# Patient Record
Sex: Male | Born: 1960 | Race: White | Hispanic: No | Marital: Married | Smoking: Former smoker
Health system: Southern US, Community
[De-identification: ages and names within clinical notes are randomized; demographics above are authoritative.]

## PROBLEM LIST (undated history)

## (undated) DIAGNOSIS — J1282 Pneumonia due to coronavirus disease 2019: Secondary | ICD-10-CM

## (undated) DIAGNOSIS — U071 COVID-19: Secondary | ICD-10-CM

## (undated) HISTORY — DX: Pneumonia due to coronavirus disease 2019: J12.82

## (undated) HISTORY — DX: COVID-19: U07.1

## (undated) HISTORY — PX: NO PAST SURGERIES: SHX2092

---

## 2013-02-02 ENCOUNTER — Encounter: Payer: Self-pay | Admitting: Family Medicine

## 2013-02-02 ENCOUNTER — Ambulatory Visit (INDEPENDENT_AMBULATORY_CARE_PROVIDER_SITE_OTHER): Payer: Managed Care, Other (non HMO) | Admitting: Family Medicine

## 2013-02-02 VITALS — BP 110/80 | HR 74 | Temp 98.1°F | Resp 16 | Ht 71.0 in | Wt 175.0 lb

## 2013-02-02 DIAGNOSIS — H8110 Benign paroxysmal vertigo, unspecified ear: Secondary | ICD-10-CM

## 2013-02-02 NOTE — Progress Notes (Signed)
  Subjective:    Patient ID: Frederick Williams, male    DOB: [birth year], 52 y.o.   MRN: [id number]  HPI Patient reports 3 weeks of episodic vertigo triggered by turning his head sitting up or lying down. He denies any ringing in the ears or hearing loss that is new in onset. He denies any headaches. He reports nausea. He also reports nystagmus occurs sometimes with vertigo. No past medical history on file. No current outpatient prescriptions on file prior to visit.   No current facility-administered medications on file prior to visit.   Allergies  Allergen Reactions  . Penicillins    History   Social History  . Marital Status: Married    Spouse Name: N/A    Number of Children: N/A  . Years of Education: N/A   Occupational History  . Not on file.   Social History Main Topics  . Smoking status: Former Games developer  . Smokeless tobacco: Former Neurosurgeon    Quit date: 02/02/2011  . Alcohol Use: Yes  . Drug Use: No  . Sexual Activity: Not on file   Other Topics Concern  . Not on file   Social History Narrative  . No narrative on file      Review of Systems  All other systems reviewed and are negative.       Objective:   Physical Exam  Vitals reviewed. Constitutional: He is oriented to person, place, and time. He appears well-developed and well-nourished.  Eyes: Conjunctivae and EOM are normal. Pupils are equal, round, and reactive to light.  Neck: Neck supple. No JVD present. No thyromegaly present.  Cardiovascular: Normal rate, regular rhythm and normal heart sounds.  Exam reveals no gallop and no friction rub.   No murmur heard. Pulmonary/Chest: Effort normal and breath sounds normal. No respiratory distress. He has no wheezes. He has no rales.  Abdominal: Soft. Bowel sounds are normal. He exhibits no distension. There is no tenderness. There is no rebound.  Lymphadenopathy:    He has no cervical adenopathy.  Neurological: He is alert and oriented to person, place, and time.  He has normal reflexes. He displays normal reflexes. No cranial nerve deficit. He exhibits normal muscle tone. Coordination normal.   patient has a positive Dix-Hallpike maneuver to the left        Assessment & Plan:  1. 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 The symptoms have been going on for more than 3 weeks. He would like to see an ENT doctor for Epley maneuvers.  I will arrange this consultation. - Ambulatory referral to ENT

## 2013-02-04 ENCOUNTER — Telehealth: Payer: Self-pay | Admitting: Family Medicine

## 2013-02-04 NOTE — Telephone Encounter (Signed)
Pt called and states that he is still having a lot of dizziness with the movement of his neck to the left and he has a ENT appt for 02/10/13  - he would like to know if he can have a work not to be out until then. 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 If so he needs it for the following date ; 10/2,10/3, 10/6 and 10/7. If ok to do pt needs it faxed to his employment  At [phone].

## 2013-02-05 NOTE — Telephone Encounter (Signed)
Please fax an out of work note for the days requested.

## 2013-02-06 ENCOUNTER — Other Ambulatory Visit: Payer: Self-pay | Admitting: Otolaryngology

## 2013-02-06 DIAGNOSIS — H905 Unspecified sensorineural hearing loss: Secondary | ICD-10-CM

## 2013-02-06 DIAGNOSIS — R42 Dizziness and giddiness: Secondary | ICD-10-CM

## 2013-02-09 ENCOUNTER — Telehealth: Payer: Self-pay | Admitting: Family Medicine

## 2013-02-09 NOTE — Telephone Encounter (Signed)
Ok with note. 

## 2013-02-09 NOTE — Telephone Encounter (Signed)
?   ok for note.

## 2013-02-11 ENCOUNTER — Ambulatory Visit
Admission: RE | Admit: 2013-02-11 | Discharge: 2013-02-11 | Disposition: A | Discharge status: Home / Self Care | Payer: Managed Care, Other (non HMO) | Source: Ambulatory Visit | Attending: Otolaryngology | Admitting: Otolaryngology

## 2013-02-11 DIAGNOSIS — R42 Dizziness and giddiness: Secondary | ICD-10-CM

## 2013-02-11 DIAGNOSIS — H905 Unspecified sensorineural hearing loss: Secondary | ICD-10-CM

## 2013-02-11 MED ORDER — GADOBENATE DIMEGLUMINE 529 MG/ML IV SOLN
16.0000 mL | Freq: Once | INTRAVENOUS | Status: AC | PRN
  Administered 2013-02-11: 16 mL via INTRAVENOUS

## 2013-02-11 NOTE — Telephone Encounter (Signed)
LMTRC

## 2013-02-13 ENCOUNTER — Encounter: Payer: Self-pay | Admitting: Family Medicine

## 2013-02-13 ENCOUNTER — Ambulatory Visit (INDEPENDENT_AMBULATORY_CARE_PROVIDER_SITE_OTHER): Payer: Managed Care, Other (non HMO) | Admitting: Family Medicine

## 2013-02-13 VITALS — BP 130/88 | HR 92 | Temp 96.7°F | Resp 18 | Ht 71.0 in | Wt 173.0 lb

## 2013-02-13 DIAGNOSIS — H8110 Benign paroxysmal vertigo, unspecified ear: Secondary | ICD-10-CM

## 2013-02-13 MED ORDER — MECLIZINE HCL 25 MG PO TABS: 25.0000 mg | ORAL_TABLET | Freq: Three times a day (TID) | ORAL | Status: DC | PRN

## 2013-02-13 NOTE — Progress Notes (Signed)
Subjective:    Patient ID: Frederick Williams, male    DOB: 1961/03/15, 52 y.o.   MRN: [id number]  HPI 02/02/13 Patient reports 3 weeks of episodic vertigo triggered by turning his head sitting up or lying down. He denies any ringing in the ears or hearing loss that is new in onset. He denies any headaches. He reports nausea. He also reports nystagmus occurs sometimes with vertigo.  At that time, my plan was: 1. 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 The symptoms have been going on for more than 3 weeks. He would like to see an ENT doctor for Epley maneuvers.  I will arrange this consultation. - Ambulatory referral to ENT  Patient saw ENT. He is has had an MRI of the brain which revealed no central cause for his vertigo. There is no evidence of cerebellar stroke. There was no acoustic neuroma. The patient states he continues to have mild vertigo with position changes. It is almost always associated with head turning although the symptoms seem to be in improving. No past medical history on file. Current Outpatient Prescriptions on File Prior to Visit  Medication Sig Dispense Refill  . loratadine (CLARITIN) 10 MG tablet Take 10 mg by mouth daily.       No current facility-administered medications on file prior to visit.   Allergies  Allergen Reactions  . Penicillins    History   Social History  . Marital Status: Married    Spouse Name: N/A    Number of Children: N/A  . Years of Education: N/A   Occupational History  . Not on file.   Social History Main Topics  . Smoking status: Former Games developer  . Smokeless tobacco: Former Neurosurgeon    Quit date: 02/02/2011  . Alcohol Use: Yes  . Drug Use: No  . Sexual Activity: Not on file   Other Topics Concern  . Not on file   Social History Narrative  . No narrative on file      Review of Systems  All other systems reviewed and are negative.       Objective:   Physical Exam   Vitals reviewed. Constitutional: He is oriented to person, place, and time. He appears well-developed and well-nourished.  Eyes: Conjunctivae and EOM are normal. Pupils are equal, round, and reactive to light.  Neck: Neck supple. No JVD present. No thyromegaly present.  Cardiovascular: Normal rate, regular rhythm and normal heart sounds.  Exam reveals no gallop and no friction rub.   No murmur heard. Pulmonary/Chest: Effort normal and breath sounds normal. No respiratory distress. He has no wheezes. He has no rales.  Abdominal: Soft. Bowel sounds are normal. He exhibits no distension. There is no tenderness. There is no rebound.  Lymphadenopathy:    He has no cervical adenopathy.  Neurological: He is alert and oriented to person, place, and time. He has normal reflexes. No cranial nerve deficit. He exhibits normal muscle tone. Coordination normal.   patient has a positive Dix-Hallpike maneuver to the left        Assessment & Plan:   1. BPPV (benign paroxysmal positional vertigo) At this point, I feel confident the patient has BPPV. I performed particle repositioning maneuvers today in the office for approx. 15 minutes. Afterward the patient felt much better. I gave him a handout on how to perform the maneuvers at home. I also gave him prescription for meclizine 25 mg every 8 hours as needed. If  he is able to perform exercises at home and the symptoms continue to subside until he can return to work next week

## 2013-10-12 ENCOUNTER — Encounter: Payer: Self-pay | Admitting: Family Medicine

## 2013-10-12 ENCOUNTER — Ambulatory Visit: Payer: Managed Care, Other (non HMO) | Admitting: Family Medicine

## 2013-10-12 ENCOUNTER — Ambulatory Visit (INDEPENDENT_AMBULATORY_CARE_PROVIDER_SITE_OTHER): Payer: PRIVATE HEALTH INSURANCE | Admitting: Family Medicine

## 2013-10-12 VITALS — BP 110/80 | HR 74 | Temp 97.2°F | Resp 16 | Ht 71.0 in | Wt 171.0 lb

## 2013-10-12 DIAGNOSIS — B9789 Other viral agents as the cause of diseases classified elsewhere: Secondary | ICD-10-CM

## 2013-10-12 DIAGNOSIS — B349 Viral infection, unspecified: Secondary | ICD-10-CM

## 2013-10-12 NOTE — Progress Notes (Signed)
   Subjective:    Patient ID: Frederick Williams, male    DOB: 1960/07/16, 53 y.o.   MRN: [id number]  HPI Patient presents with less than 24 hours of dull diffuse headache, slight cough, some nausea, and some diarrhea. His wife has been battling a viral illness for one week characterized by chest congestion cough and fevers.  Patient denies any tick bites he denies any meningismus. There is no rash. He denies any sore throat or otalgia. He does report slightly worse tinnitus than normal.  He denies any dysuria or hematuria. No past medical history on file. No current outpatient prescriptions on file prior to visit.   No current facility-administered medications on file prior to visit.   Allergies  Allergen Reactions  . Penicillins    History   Social History  . Marital Status: Married    Spouse Name: N/A    Number of Children: N/A  . Years of Education: N/A   Occupational History  . Not on file.   Social History Main Topics  . Smoking status: Former Games developer  . Smokeless tobacco: Former Neurosurgeon    Quit date: 02/02/2011  . Alcohol Use: Yes  . Drug Use: No  . Sexual Activity: Not on file   Other Topics Concern  . Not on file   Social History Narrative  . No narrative on file      Review of Systems  All other systems reviewed and are negative.      Objective:   Physical Exam  Vitals reviewed. Constitutional: He is oriented to person, place, and time. He appears well-developed and well-nourished. No distress.  HENT:  Head: Normocephalic and atraumatic.  Right Ear: External ear normal.  Left Ear: External ear normal.  Nose: Nose normal.  Mouth/Throat: Oropharynx is clear and moist. No oropharyngeal exudate.  Eyes: Conjunctivae are normal.  Neck: Neck supple.  Cardiovascular: Normal rate, regular rhythm and normal heart sounds.  Exam reveals no gallop and no friction rub.   No murmur heard. Pulmonary/Chest: Effort normal and breath sounds normal. No respiratory distress.  He has no wheezes. He has no rales. He exhibits no tenderness.  Abdominal: Soft. Bowel sounds are normal. He exhibits no distension and no mass. There is no tenderness. There is no rebound and no guarding.  Lymphadenopathy:    He has no cervical adenopathy.  Neurological: He is alert and oriented to person, place, and time. He has normal reflexes. He displays normal reflexes. No cranial nerve deficit. Coordination normal.  Skin: No rash noted. He is not diaphoretic.          Assessment & Plan:  1. Viral syndrome At this point, it is too early to determine what the cause of the patient's symptoms may be. However based on history and his recent sick contacts, I suspect a viral syndrome. I recommended tincture of time for the next 5-7 days.  I would recommend symptomatic care only. He can use Tylenol or ibuprofen as needed for headache or fever. He can use Imodium as needed for diarrhea. He can use Mucinex as needed for cough. I recommended rest and that he push fluids.  Recheck in 48 hours if no better or sooner if getting worse or changing.

## 2013-10-14 ENCOUNTER — Encounter: Payer: Self-pay | Admitting: Family Medicine

## 2013-12-31 ENCOUNTER — Encounter: Payer: Self-pay | Admitting: Family Medicine

## 2015-07-25 ENCOUNTER — Ambulatory Visit (INDEPENDENT_AMBULATORY_CARE_PROVIDER_SITE_OTHER): Payer: Managed Care, Other (non HMO) | Admitting: Pediatrics

## 2015-07-25 ENCOUNTER — Encounter: Payer: Self-pay | Admitting: Pediatrics

## 2015-07-25 VITALS — BP 126/82 | HR 78 | Temp 97.2°F | Ht 71.0 in | Wt 179.4 lb

## 2015-07-25 DIAGNOSIS — J069 Acute upper respiratory infection, unspecified: Secondary | ICD-10-CM

## 2015-07-25 NOTE — Progress Notes (Signed)
    Subjective:    Patient ID: Frederick Williams, male    DOB: 1960/09/25, 55 y.o.   MRN: [id number]  CC: Nasal Congestion and Sinus Pressure   HPI: Frederick Williams is a 55 y.o. male presenting for Nasal Congestion and Sinus Pressure  5 days ago woke up with sore throat Went home early from work Seen in clinic at work next door Given prednisone for bronchitis, took one dose, didn't help Taking ibuprofen 3 tabs three times a day Lots of water Woke up yesterday    [facility]  Decreased Interest 0  Down, Depressed, Hopeless 0  PHQ - 2 Score 0    ROS: All systems negative other than what is in HPI  Fam hx: no current sick contacts at home  Social History   Social History  . Marital Status: Married    Spouse Name: N/A  . Number of Children: N/A  . Years of Education: N/A   Occupational History  . Not on file.   Social History Main Topics  . Smoking status: Former Games developermoker  . Smokeless tobacco: Former NeurosurgeonUser    Quit date: 02/02/2011  . Alcohol Use: Yes  . Drug Use: No  . Sexual Activity: Not on file   Other Topics Concern  . Not on file   Social History Narrative        Objective:    BP 126/82 mmHg  Pulse 78  Temp(Src) 97.2 F (36.2 C) (Oral)  Ht 5\' 11"  (1.803 m)  Wt 179 lb 6.4 oz (81.375 kg)  BMI 25.03 kg/m2  Wt Readings from Last 3 Encounters:  07/25/15 179 lb 6.4 oz (81.375 kg)  10/12/13 171 lb (77.565 kg)  02/13/13 173 lb (78.472 kg)     Gen: NAD, alert, cooperative with exam, NCAT EYES: EOMI, no scleral injection or icterus ENT:  TMs pearly gray b/l, OP without erythema, slight pressure over max and frontal sinuses with palpation LYMPH: no cervical LAD CV: NRRR, normal S1/S2, no murmur, distal pulses 2+ b/l Resp: CTABL, no wheezes, normal WOB Abd: +BS, soft, NTND. no guarding or organomegaly Ext: No edema, warm Neuro: Alert and oriented, strength equal b/l UE and LE, coordination grossly normal MSK: normal muscle bulk       Assessment & Plan:    Frederick Williams was seen today for nasal congestion and sinus pressure, likely due to viral illness. Discussed symptomatic care as below.   Diagnoses and all orders for this visit:  Acute URI  Cetirizine 10mg  daily Netipot with distilled water 2-3 times a day to clear out sinuses Or Normal saline nasal spray Flonase steroid nasal spray Ibuprofen 600mg  three times a day Lots of fluids Can take benadryl at night  Call if not improving by the end of the week.  Follow up plan: As needed  Rex Krasarol Vincent, MD [facility] 07/25/2015, 8:40 AM

## 2015-07-25 NOTE — Patient Instructions (Addendum)
Cetirizine 10mg  Netipot with distilled water 2-3 times a day to clear out sinuses Or Normal saline nasal spray Flonase steroid nasal spray Ibuprofen 600mg  three times a day Lots of fluids Can take benadryl at night  Call if not improving by the end of the week.

## 2015-07-26 ENCOUNTER — Telehealth: Payer: Self-pay | Admitting: Pediatrics

## 2015-07-26 NOTE — Telephone Encounter (Signed)
Work note was faxed, patient informed

## 2016-02-16 ENCOUNTER — Ambulatory Visit (INDEPENDENT_AMBULATORY_CARE_PROVIDER_SITE_OTHER): Payer: Managed Care, Other (non HMO) | Admitting: Family Medicine

## 2016-02-16 ENCOUNTER — Encounter: Payer: Self-pay | Admitting: Family Medicine

## 2016-02-16 VITALS — BP 130/89 | HR 85 | Temp 97.1°F | Ht 71.0 in | Wt 174.2 lb

## 2016-02-16 DIAGNOSIS — H66002 Acute suppurative otitis media without spontaneous rupture of ear drum, left ear: Secondary | ICD-10-CM | POA: Diagnosis not present

## 2016-02-16 MED ORDER — AZITHROMYCIN 250 MG PO TABS: ORAL_TABLET | ORAL | 0 refills | Status: DC

## 2016-02-16 NOTE — Progress Notes (Signed)
BP 130/89   Pulse 85   Temp 97.1 F (36.2 C) (Oral)   Ht 5\' 11"  (1.803 m)   Wt 174 lb 4 oz (79 kg)   BMI 24.30 kg/m    Subjective:    Patient ID: Frederick Williams, male    DOB: 1961-02-08, 55 y.o.   MRN: [id number]  HPI: Frederick Williams is a 55 y.o. male presenting on 02/16/2016 for Left ear pain, dizziness (began 4 days ago; pt works [employer] and saw person at clinic there today who told him he has an inner ear infection but they could not prescribe medication) and Headache   HPI Left ear pain Patient comes in today with complaints of left ear pain that has been going on for the past 4 days. [facility] and told him he that they had seen an ear ear infection but that they could not treat it because they did not have prescribing privileges. He denies any fevers or chills. He does complain of some dizziness and lightheadedness associated with some headaches mostly surrounding the left side near his left ear. He also has significant pain just behind his left ear as well. He denies any sick contacts that he knows of and he has not had these recurrently in the past.  Relevant past medical, surgical, family and social history reviewed and updated as indicated. Interim medical history since our last visit reviewed. Allergies and medications reviewed and updated.  Review of Systems  Constitutional: Negative for chills and fever.  HENT: Positive for congestion and ear pain. Negative for ear discharge, postnasal drip, rhinorrhea, sinus pressure, sneezing, sore throat and voice change.   Eyes: Negative for pain, discharge, redness and visual disturbance.  Respiratory: Negative for cough, chest tightness, shortness of breath and wheezing.   Cardiovascular: Negative for chest pain and leg swelling.  Musculoskeletal: Negative for gait problem.  Skin: Negative for rash.  All other systems reviewed and are negative.   Per HPI unless specifically indicated above       Medication List       Accurate as of 02/16/16 12:40 PM. Always use your most recent med list.          azithromycin 250 MG tablet Commonly known as:  ZITHROMAX Take 2 the first day and then one each day after.   cetirizine 10 MG tablet Commonly known as:  ZYRTEC Take 10 mg by mouth daily.          Objective:    BP 130/89   Pulse 85   Temp 97.1 F (36.2 C) (Oral)   Ht 5\' 11"  (1.803 m)   Wt 174 lb 4 oz (79 kg)   BMI 24.30 kg/m   Wt Readings from Last 3 Encounters:  02/16/16 174 lb 4 oz (79 kg)  07/25/15 179 lb 6.4 oz (81.4 kg)  10/12/13 171 lb (77.6 kg)    Physical Exam  Constitutional: He is oriented to person, place, and time. He appears well-developed and well-nourished. No distress.  HENT:  Right Ear: Tympanic membrane, external ear and ear canal normal.  Left Ear: Ear canal normal. There is tenderness. No mastoid tenderness. Tympanic membrane is injected, erythematous and bulging. Tympanic membrane is not scarred and not perforated. A middle ear effusion is present.  Nose: No mucosal edema, rhinorrhea or sinus tenderness. No epistaxis. Right sinus exhibits no maxillary sinus tenderness and no frontal sinus tenderness. Left sinus exhibits no maxillary sinus  tenderness and no frontal sinus tenderness.  Mouth/Throat: Uvula is midline and mucous membranes are normal. No oropharyngeal exudate, posterior oropharyngeal edema, posterior oropharyngeal erythema or tonsillar abscesses.  Eyes: Conjunctivae and EOM are normal. Pupils are equal, round, and reactive to light. Right eye exhibits no discharge. Left eye exhibits no discharge. No scleral icterus.  Neck: Neck supple. No thyromegaly present.  Cardiovascular: Normal rate, regular rhythm, normal heart sounds and intact distal pulses.   No murmur heard. Pulmonary/Chest: Effort normal and breath sounds normal. No respiratory distress. He has no wheezes. He has no rales.  Musculoskeletal: Normal range of motion. He exhibits  no edema.  Lymphadenopathy:    He has no cervical adenopathy.  Neurological: He is alert and oriented to person, place, and time. Coordination normal.  Skin: Skin is warm and dry. No rash noted. He is not diaphoretic.  Psychiatric: He has a normal mood and affect. His behavior is normal.  Nursing note and vitals reviewed.   No results found for this or any previous visit.    Assessment & Plan:   Problem List Items Addressed This Visit    None    Visit Diagnoses    Acute suppurative otitis media of left ear without spontaneous rupture of tympanic membrane, recurrence not specified    -  Primary   Relevant Medications   azithromycin (ZITHROMAX) 250 MG tablet       Follow up plan: Return if symptoms worsen or fail to improve.  Counseling provided for all of the vaccine components No orders of the defined types were placed in this encounter.   Arville Care, MD [facility] 02/16/2016, 12:40 PM

## 2016-07-02 ENCOUNTER — Encounter: Payer: Self-pay | Admitting: Family Medicine

## 2016-07-02 ENCOUNTER — Ambulatory Visit (INDEPENDENT_AMBULATORY_CARE_PROVIDER_SITE_OTHER): Payer: Managed Care, Other (non HMO) | Admitting: Family Medicine

## 2016-07-02 VITALS — BP 133/81 | HR 80 | Temp 97.6°F | Ht 71.0 in | Wt 179.0 lb

## 2016-07-02 DIAGNOSIS — L723 Sebaceous cyst: Secondary | ICD-10-CM | POA: Diagnosis not present

## 2016-07-02 DIAGNOSIS — L089 Local infection of the skin and subcutaneous tissue, unspecified: Secondary | ICD-10-CM

## 2016-07-02 MED ORDER — ACETAMINOPHEN-CODEINE #3 300-30 MG PO TABS: 1.0000 | ORAL_TABLET | ORAL | 0 refills | Status: DC | PRN

## 2016-07-02 MED ORDER — CIPROFLOXACIN HCL 500 MG PO TABS: 500.0000 mg | ORAL_TABLET | Freq: Two times a day (BID) | ORAL | 0 refills | Status: DC

## 2016-07-02 NOTE — Progress Notes (Signed)
Subjective:  Patient ID: Frederick Williams, male    DOB: 1961/03/13  Age: 56 y.o. MRN: [id number]  CC: Cyst (pt here today c/o cyst on his back that has become inflamed and red)   HPI Frederick CrapeJames L Williams presents for Sit back against the seat due to pain. Onset 2 days ago. Increasing. He's had to shower with dirty water because of some problems with his well at home. 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 Patient would like to have the cyst removed.  History Frederick Williams has no past medical history on file.   He has no past surgical history on file.   His family history includes Dementia in his mother; Hypertension in his sister.He reports that he has quit smoking. He quit smokeless tobacco use about 5 years ago. He reports that he drinks alcohol. He reports that he does not use drugs.  No current outpatient prescriptions on file prior to visit.   No current facility-administered medications on file prior to visit.     ROS Review of Systems Noncontributory Objective:  BP 133/81   Pulse 80   Temp 97.6 F (36.4 C) (Oral)   Ht 5\' 11"  (1.803 m)   Wt 179 lb (81.2 kg)   BMI 24.97 kg/m   Physical Exam  Constitutional: He is oriented to person, place, and time. He appears well-developed and well-nourished. No distress.  HENT:  Head: Normocephalic.  Cardiovascular: Normal heart sounds.   Pulmonary/Chest: Breath sounds normal.  Musculoskeletal: Normal range of motion.  Neurological: He is alert and oriented to person, place, and time.  Skin: Skin is warm and dry.  There is a 2 x 3 cm raised fluctuant and erythematous cyst with a double head on it. This is just to the right of the midline and just below the angle of the scapula on the right.  Psychiatric: He has a normal mood and affect.    Assessment & Plan:   Frederick Williams was seen today for cyst.  Diagnoses and all orders for this visit:  Infected sebaceous cyst  Other orders -     ciprofloxacin  (CIPRO) 500 MG tablet; Take 1 tablet (500 mg total) by mouth 2 (two) times daily. -     acetaminophen-codeine (TYLENOL #3) 300-30 MG tablet; Take 1-2 tablets by mouth every 4 (four) hours as needed for moderate pain.   I have discontinued Frederick Williams's cetirizine and azithromycin. I am also having him start on ciprofloxacin and acetaminophen-codeine.  Meds ordered this encounter  Medications  . ciprofloxacin (CIPRO) 500 MG tablet    Sig: Take 1 tablet (500 mg total) by mouth 2 (two) times daily.    Dispense:  14 tablet    Refill:  0  . acetaminophen-codeine (TYLENOL #3) 300-30 MG tablet    Sig: Take 1-2 tablets by mouth every 4 (four) hours as needed for moderate pain.    Dispense:  24 tablet    Refill:  0   I&D: Region was anesthetized with 2% lidocaine using about 5mL of it. Incision was made on anterior medial aspect of the wound. Significant serosanguineous and purulent drainage was exuded. Culture was taken. Forceps was used to probe the area and break apart any loculations. Region was packed with about 6 inches of quarter inch iodoform gauze. Pressure dressing was placed over top. Bleeding was minimal and patient tolerated procedure well.  Follow-up: Return in about 2 days (around 07/04/2016).  Mechele ClaudeWarren Coreyon Nicotra, M.D.

## 2016-07-02 NOTE — Addendum Note (Signed)
Addended by: Margurite AuerbachOMPTON, Jhordan Mckibben G on: 07/02/2016 05:29 PM   Modules accepted: Orders

## 2016-07-05 ENCOUNTER — Telehealth: Payer: Self-pay | Admitting: Family Medicine

## 2016-07-05 NOTE — Telephone Encounter (Signed)
Spoke with pt, area healing nicely, still draining some, no surrounding redness, soreness much improved. OK to stop the anitbiotic. He is off work the next three days. 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 Let us know if not improving.

## 2016-07-05 NOTE — Telephone Encounter (Signed)
Spoke with pt Pt has taken 2 of the Cipro tablets Now he is having L lower calf pain into the ankle that started 8 hrs after taking 1st dose of Cipro Pt is worried about tendon damage Please review and advise

## 2016-07-06 LAB — ANAEROBIC AND AEROBIC CULTURE

## 2016-08-22 ENCOUNTER — Telehealth: Payer: Self-pay | Admitting: Family Medicine

## 2016-08-22 NOTE — Telephone Encounter (Signed)
Patient was seen 2/26 to drain a cyst on his back and was referred to go see Lynwood Dawley at [facility]. Patient states he went to see Tiffany 4/16 and had cyst removed and got stiches. Patient can not have stiches removed until after 4/30 and is wanting to know if Dr. Oswaldo Done will remove them for him so he won't have to drive all the way to [facility]. Patient is also wanting to know how much it would be for him to be seen and get the stiches removed. Patient aware that Dr. Oswaldo Done will not be back in the office until Monday and also aware I am sending the message to billing for price on a visit. Please advise.

## 2016-08-24 NOTE — Telephone Encounter (Signed)
Yes we can remove stitches but will be extra charge- where as if done at tiffanys office it will not be an added charge

## 2016-08-27 NOTE — Telephone Encounter (Signed)
Patient will come to this office to have stitches removed on 09/03/16

## 2016-09-03 ENCOUNTER — Encounter: Payer: Self-pay | Admitting: Pediatrics

## 2016-09-03 ENCOUNTER — Ambulatory Visit (INDEPENDENT_AMBULATORY_CARE_PROVIDER_SITE_OTHER): Payer: Managed Care, Other (non HMO) | Admitting: Pediatrics

## 2016-09-03 VITALS — BP 139/90 | HR 89 | Temp 97.5°F | Ht 71.0 in | Wt 182.2 lb

## 2016-09-03 DIAGNOSIS — L720 Epidermal cyst: Secondary | ICD-10-CM

## 2016-09-03 DIAGNOSIS — Z4802 Encounter for removal of sutures: Secondary | ICD-10-CM | POA: Diagnosis not present

## 2016-09-03 NOTE — Progress Notes (Signed)
  Subjective:   Patient ID: Frederick Williams, male    DOB: 1960-12-10, 56 y.o.   MRN: [id number] CC: Suture / Staple Removal (Back)  HPI: Frederick Williams is a 56 y.o. male presenting for Suture / Staple Removal (Back)  Had infected epidermal cyst, drained last month, 2 weeks seen by dermatology for cyst removal, had two cysts on back removed Has been healing well works driving trucks, does lean against the site often, bothered by the sutures poking him, otherwise has been healing well, no drainage  Relevant past medical, surgical, family and social history reviewed. Allergies and medications reviewed and updated. History  Smoking Status  . Former Smoker  Smokeless Tobacco  . Former Neurosurgeon  . Quit date: 02/02/2011   ROS: Per HPI   Objective:    BP 139/90   Pulse 89   Temp 97.5 F (36.4 C) (Oral)   Ht  (1.803 m)   Wt 182 lb 3.2 oz (82.6 kg)   BMI 25.41 kg/m   Wt Readings from Last 3 Encounters:  09/03/16 182 lb 3.2 oz (82.6 kg)  07/02/16 179 lb (81.2 kg)  02/16/16 174 lb 4 oz (79 kg)    Gen: NAD, alert, cooperative with exam, NCAT EYES: EOMI, no conjunctival injection, or no icterus CV: WWP Resp: normal WOB Abd: +BS, soft, NTND. no guarding or organomegaly Neuro: Alert and oriented Skin: back with two well-healing incisions, surrounding slight erythema, no tenderness   Assessment & Plan:  Frederick Williams was seen today for suture / staple removal.  Diagnoses and all orders for this visit:  Encounter for removal of sutures  Epidermal cyst 3 sutures removed from 2 incisions R mid-lower back. Well healing  Follow up plan: prn Rex Kras, MD [facility]

## 2017-03-20 ENCOUNTER — Ambulatory Visit (INDEPENDENT_AMBULATORY_CARE_PROVIDER_SITE_OTHER): Payer: Managed Care, Other (non HMO)

## 2017-03-20 ENCOUNTER — Ambulatory Visit (INDEPENDENT_AMBULATORY_CARE_PROVIDER_SITE_OTHER): Payer: Managed Care, Other (non HMO) | Admitting: Family Medicine

## 2017-03-20 ENCOUNTER — Encounter: Payer: Self-pay | Admitting: Family Medicine

## 2017-03-20 VITALS — BP 123/77 | HR 85 | Temp 97.2°F | Ht 71.0 in | Wt 178.0 lb

## 2017-03-20 DIAGNOSIS — R0789 Other chest pain: Secondary | ICD-10-CM | POA: Diagnosis not present

## 2017-03-20 DIAGNOSIS — R05 Cough: Secondary | ICD-10-CM

## 2017-03-20 DIAGNOSIS — R071 Chest pain on breathing: Secondary | ICD-10-CM | POA: Diagnosis not present

## 2017-03-20 DIAGNOSIS — R059 Cough, unspecified: Secondary | ICD-10-CM

## 2017-03-20 MED ORDER — AZITHROMYCIN 250 MG PO TABS: ORAL_TABLET | ORAL | 0 refills | Status: DC

## 2017-03-20 NOTE — Progress Notes (Signed)
Subjective: CC: GI upset/ fever PCP: Johna SheriffVincent, Carol L, MD ZOX:WRUEAHPI:Frederick Williams is a 56 y.o. male presenting to clinic today for:  1. GI upset/ Fever Patient reports onset of symptoms Monday.  He notes that he is felt like his stomach was "rolling".  Denies overt nausea.  He denies vomiting, diarrhea, abdominal pain.  He is moving stool normally.  No melena, no hematochezia.  He reports that he feels warm but has had no measured fevers at home.  Not taking any antipyretics.  Additionally, he reports headache that feels "like it is in a vice grip" with associated increased ringing in ears.  He notes that this morning when he took a deep breath then his a left lower ribs hurt.  He does report mild cough for which he is taking 2 allergy pills daily for.  He is eating and drinking normally.  Denies dysuria, urinary frequency, urinary urgency, hematuria.  Denies nasal congestion, chest congestion, rhinorrhea, hemoptysis, lower extremity swelling or pain.  Denies heart palpitations, substernal chest pain, diaphoresis.  He does report dry mouth.  No known sick contacts.  He is a Naval architecttruck driver.  He reports that he stops and gets out of the truck at least every couple hours.  The longest time that he said it in the truck is when he is driving to [place].   Allergies  Allergen Reactions  . Ciprofloxacin     Tendon pain  . Penicillins Rash   No past medical history on file. Family History  Problem Relation Age of Onset  . Dementia Mother   . Hypertension Sister    No current outpatient medications on file.  Social History   Socioeconomic History  . Marital status: Married    Spouse name: None  . Number of children: None  . Years of education: None  . Highest education level: None  Social Needs  . Financial resource strain: None  . Food insecurity - worry: None  . Food insecurity - inability: None  . Transportation needs - medical: None  . Transportation needs - non-medical: None    Occupational History  . None  Tobacco Use  . Smoking status: Former Games developermoker  . Smokeless tobacco: Former NeurosurgeonUser    Quit date: 02/02/2011  Substance and Sexual Activity  . Alcohol use: Yes  . Drug use: No  . Sexual activity: None  Other Topics Concern  . None  Social History Narrative  . None   Health Maintenance: Flu shot   ROS: Per HPI  Objective: Office vital signs reviewed. BP 123/77   Pulse 85   Temp (!) 97.2 F (36.2 C) (Oral)   Ht 5\' 11"  (1.803 m)   Wt 178 lb (80.7 kg)   BMI 24.83 kg/m   Physical Examination:  General: Awake, alert, well nourished, well appearing male, No acute distress HEENT: Normal    Neck: No masses palpated. No lymphadenopathy    Ears: Tympanic membranes intact, normal light reflex, no erythema, no bulging    Eyes: PERRLA, extraocular membranes intact, sclera white, no ocular discharge    Nose: nasal turbinates moist, clear nasal discharge    Throat: moist mucus membranes, no erythema, no tonsillar exudate.  Airway is patent Cardio: regular rate and rhythm, S1S2 heard, no murmurs appreciated Pulm: clear to auscultation bilaterally, no wheezes, rhonchi or rales; normal work of breathing on room air GI: soft, non-tender, non-distended, bowel sounds present x4, no hepatomegaly, no splenomegaly, no masses Ext: Warm, well perfused.  No edema.  +  2 distal pulses.  No tenderness to the calves.  Calves equal in girth.  Negative Homans sign.  Dg Chest 2 View  Result Date: 03/20/2017 CLINICAL DATA:  Fever. EXAM: CHEST  2 VIEW COMPARISON:  None. FINDINGS: The heart size and mediastinal contours are within normal limits. Both lungs are free of consolidation or edema. No effusion or pneumothorax. The visualized skeletal structures are unremarkable. IMPRESSION: No active cardiopulmonary disease. Electronically Signed   By: Elsie StainJohn T Curnes M.D.   On: 03/20/2017 13:48    Assessment/ Plan: 56 y.o. male   1. Cough Patient with fairly unremarkable  cardiopulmonary exam.  He has normal respiratory rate.  He is afebrile with normal vital signs.  He is well-appearing on exam.  He stresses that he has a past medical history of bronchitis which does not resolve without antibiotics.  Chest x-ray was obtained for further evaluation.  There are some bronchitic changes that I can appreciate on personal review.  No pulmonary infiltrates appreciated.  I suspect that he is likely having a viral upper respiratory infection.  Home care instructions were reviewed and a handout was provided.  I did provide patient a pocket prescription of azithromycin to take if he develops any of the signs and symptoms of bacterial infection which were reviewed during today's office visit.  He voiced understanding. Strict return precautions and reasons for emergent evaluation in the emergency department review with patient.  They voiced understanding and will follow-up as needed. - DG Chest 2 View; Future  2. Costochondral chest pain Pain was reproducible on exam and located on left lateral ribs 9 through 12.  I suspect that this is secondary to cough.  Well score for PE is 0.  If his symptoms are not suggestive of cardiac ischemia.  I recommended oral NSAID as needed. - DG Chest 2 View; Future   Orders Placed This Encounter  Procedures  . DG Chest 2 View    Standing Status:   Future    Number of Occurrences:   1    Standing Expiration Date:   05/20/2018    Order Specific Question:   Reason for Exam (SYMPTOM  OR DIAGNOSIS REQUIRED)    Answer:   cough, low left sided chest pain with deep breathing    Order Specific Question:   Preferred imaging location?    Answer:   Internal    Order Specific Question:   Radiology Contrast Protocol - do NOT remove file path    Answer:   [email or web address]   Meds ordered this encounter  Medications  . azithromycin (ZITHROMAX) 250 MG tablet    Sig: Take 2 tablets day 1, then take 1 tablet daily  days 2-5.    Dispense:  6 tablet    Refill:  0     Ashly Hulen SkainsM Gottschalk, DO [facility] [phone]

## 2017-03-20 NOTE — Patient Instructions (Signed)
It appears that you have a viral upper respiratory infection.  This may be an early bronchitis.  Take Motrin for your fevers, headache.    - Get plenty of rest and drink plenty of fluids. - Try to breathe moist air. Use a cold mist humidifier. - Consume warm fluids (soup or tea) to provide relief for a stuffy nose and to loosen phlegm. - For nasal stuffiness, try saline nasal spray or a Neti Pot. Afrin nasal spray can also be used but this product should not be used longer than 3 days or it will cause rebound nasal stuffiness (worsening nasal congestion). - For sore throat pain relief: suck on throat lozenges, hard candy or popsicles; gargle with warm salt water (1/4 tsp. salt per 8 oz. of water); and eat soft, bland foods. - Eat a well-balanced diet. If you cannot, ensure you are getting enough nutrients by taking a daily multivitamin. - Avoid dairy products, as they can thicken phlegm. - Avoid alcohol, as it impairs your body's immune system.  CONTACT YOUR DOCTOR IF YOU EXPERIENCE ANY OF THE FOLLOWING: - High fever - Ear pain - Sinus-type headache - Unusually severe cold symptoms - Cough that gets worse while other cold symptoms improve - Flare up of any chronic lung problem, such as asthma - Your symptoms persist longer than 2 weeks

## 2017-03-22 ENCOUNTER — Telehealth: Payer: Self-pay | Admitting: Pediatrics

## 2017-03-22 NOTE — Telephone Encounter (Signed)
Pt notified note is ready for pick up Note to front

## 2017-03-25 ENCOUNTER — Encounter: Payer: Self-pay | Admitting: Family Medicine

## 2017-03-25 ENCOUNTER — Ambulatory Visit (INDEPENDENT_AMBULATORY_CARE_PROVIDER_SITE_OTHER): Payer: Managed Care, Other (non HMO) | Admitting: Family Medicine

## 2017-03-25 VITALS — BP 131/82 | HR 72 | Temp 97.5°F | Ht 71.0 in | Wt 180.0 lb

## 2017-03-25 DIAGNOSIS — R5381 Other malaise: Secondary | ICD-10-CM

## 2017-03-25 DIAGNOSIS — R5383 Other fatigue: Secondary | ICD-10-CM | POA: Diagnosis not present

## 2017-03-25 NOTE — Patient Instructions (Signed)
I will contact you will the results of your labs.  If anything is abnormal, I will call you.    Weakness Weakness is a lack of strength. You may feel weak all over your body (generalized), or you may feel weak in one specific part of your body (focal). Common causes of weakness include:  Infection and immune system disorders.  Physical exhaustion.  Internal bleeding or other blood loss that results in a lack of red blood cells (anemia).  Dehydration.  An imbalance in mineral (electrolyte) levels, such as potassium.  Heart disease, circulation problems, or stroke.  Other causes include:  Some medicines or cancer treatment.  Stress, anxiety, or depression.  Nervous system disorders.  Thyroid disorders.  Loss of muscle strength because of age or inactivity.  Poor sleep quality or sleep disorders.  The cause of your weakness may not be known. Some causes of weakness can be serious, so it is important to see your health care provider. Follow these instructions at home:  Rest as needed.  Try to get enough sleep. Talk to your health care provider about how much sleep you need each night.  Take over-the-counter and prescription medicines only as told by your health care provider.  Eat a healthy, well-balanced diet. This includes: ? Proteins to build muscles, such as lean meats and fish. ? Fresh fruits and vegetables. ? Carbohydrates to boost energy, such as whole grains.  Drink enough fluid to keep your urine clear or pale yellow.  Do strength exercises, such as arm curls and leg raises, for 30 minutes at least 2 days a week or as told by your health care provider.  Consider working with a physical therapist or trainer who can develop an exercise plan to help you gain muscle strength.  Keep all follow-up visits as told by your health care provider. This is important. Contact a health care provider if:  Your weakness does not improve or gets worse.  Your weakness  affects your ability to think clearly.  Your weakness affects your ability to do your normal daily activities. Get help right away if:  You develop sudden weakness.  You have trouble breathing or shortness of breath.  You have problems with your vision.  You have trouble talking or swallowing.  You have trouble standing or walking.  You have chest pain.  You are light-headed or lose consciousness. This information is not intended to replace advice given to you by your health care provider. Make sure you discuss any questions you have with your health care provider. Document Released: 04/23/2005 Document Revised: 05/19/2015 Document Reviewed: 02/11/2015 Elsevier Interactive Patient Education  [employer]2018 Elsevier Inc.

## 2017-03-25 NOTE — Progress Notes (Signed)
Subjective: CC: not feeling better PCP: Frederick Maize, MD NTI:RWERX Frederick Williams is a 56 y.o. male presenting to clinic today for:  1. Fatigue/ Malaise Patient was seen on 03/20/2017 for GI upset, rib pain, mild cough.  He had a chest x-ray that showed what looked to be early bronchitis.  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  Today here presents for follow-up and reports that respiratory symptoms have completely resolved.  He continues to feel tired, warm and has a "foggy brain".  He notes that symptoms had actually completely resolved on Friday and he was feeling his normal self.  He notes that he called work to let them know that he would actually be coming in on Monday to work.  On Saturday, he reports he was driving to the bank and suddenly felt warm, foggy brain and tired.  He is wondering why he is not feeling baseline.  He denies cough, rhinorrhea, congestion, chest pain, shortness of breath, fevers, dysuria, hematuria, melena, hematochezia.  He reports intermittent diarrhea.  No abdominal pain, nausea, vomiting.  He is running heat currently.  He reports that his carbon monoxide alarms are up-to-date.  His wife is feeling fine.  He reports he will need a work note for today and possibly Wednesday.  No family history of thyroid disease or autoimmune disorder that he knows of.  Allergies  Allergen Reactions  . Ciprofloxacin     Tendon pain  . Penicillins Rash   No past medical history on file. Family History  Problem Relation Age of Onset  . Dementia Mother   . Hypertension Sister    No current outpatient medications on file.  Social Hx:non smoker  ROS: Per HPI  Objective: Office vital signs reviewed. BP 131/82   Pulse 72   Temp (!) 97.5 F (36.4 C) (Oral)   Ht '5\' 11"'  (1.803 m)   Wt 180 lb (81.6 kg)   BMI 25.10 kg/m   Physical Examination:  General: Awake, alert, well nourished, well  appearing male, No acute distress HEENT: Normal    Neck: No masses palpated. No lymphadenopathy, thyroid not palpable    Eyes: PERRLA, extraocular membranes intact, sclera white, no ocular discharge    Nose: nasal turbinates moist, no nasal discharge    Throat: moist mucus membranes, no erythema, no tonsillar exudate.  Airway is patent Cardio: regular rate and rhythm, S1S2 heard, no murmurs appreciated Pulm: clear to auscultation bilaterally, no wheezes, rhonchi or rales; normal work of breathing on room air MSK: Has full active range of motions of all extremities. 5/5 upper extremity and lower extremity strength Neuro: No focal neurologic deficits.  Normal upper extremity and lower extremity cerebellar testing.  Cranial nerves II through XII grossly intact. AOx3  Assessment/ Plan: 56 y.o. male   1. Malaise and fatigue Symptoms are likely secondary to recent respiratory illness.  He is afebrile with normal vital signs on exam.  He is well-appearing on exam and has no focal findings.  His symptoms are very vague.  Will obtain metabolic labs to rule this out as etiology.  I did discuss with him that if he will likely not be feeling baseline for a couple of weeks following an illness.  Would recommend holding off on more extensive workup for at least a few weeks unless symptoms worsen.  We will contact him once his labs are back.  Work note provided for today and  for Wednesday per his request.  Strict return precautions and reasons for emergent evaluation in the emergency department review with patient.  They voiced understanding and will follow-up as needed.  Patient to follow-up with PCP. - CMP14+EGFR - CBC with Differential - TSH   Orders Placed This Encounter  Procedures  . CMP14+EGFR  . CBC with Differential  . TSH    Frederick Windell Moulding, DO Frederick Williams [phone]

## 2017-03-26 LAB — CMP14+EGFR
ALBUMIN: 4.5 g/dL (ref 3.5–5.5)
ALK PHOS: 77 IU/L (ref 39–117)
ALT: 27 IU/L (ref 0–44)
AST: 22 IU/L (ref 0–40)
Albumin/Globulin Ratio: 1.8 (ref 1.2–2.2)
BUN / CREAT RATIO: 17 (ref 9–20)
BUN: 18 mg/dL (ref 6–24)
Bilirubin Total: 0.4 mg/dL (ref 0.0–1.2)
CHLORIDE: 103 mmol/L (ref 96–106)
CO2: 23 mmol/L (ref 20–29)
CREATININE: 1.08 mg/dL (ref 0.76–1.27)
Calcium: 9 mg/dL (ref 8.7–10.2)
GFR calc Af Amer: 88 mL/min/{1.73_m2} (ref >59)
GFR calc non Af Amer: 76 mL/min/{1.73_m2} (ref >59)
GLUCOSE: 97 mg/dL (ref 65–99)
Globulin, Total: 2.5 g/dL (ref 1.5–4.5)
Potassium: 4.7 mmol/L (ref 3.5–5.2)
Sodium: 140 mmol/L (ref 134–144)
Total Protein: 7 g/dL (ref 6.0–8.5)

## 2017-03-26 LAB — CBC WITH DIFFERENTIAL/PLATELET
BASOS ABS: 0 10*3/uL (ref 0.0–0.2)
Basos: 0 %
EOS (ABSOLUTE): 0.2 10*3/uL (ref 0.0–0.4)
EOS: 3 %
HEMATOCRIT: 48.7 % (ref 37.5–51.0)
Hemoglobin: 16.3 g/dL (ref 13.0–17.7)
IMMATURE GRANS (ABS): 0 10*3/uL (ref 0.0–0.1)
IMMATURE GRANULOCYTES: 0 %
LYMPHS: 25 %
Lymphocytes Absolute: 1.4 10*3/uL (ref 0.7–3.1)
MCH: 30.9 pg (ref 26.6–33.0)
MCHC: 33.5 g/dL (ref 31.5–35.7)
MCV: 92 fL (ref 79–97)
MONOCYTES: 6 %
Monocytes Absolute: 0.4 10*3/uL (ref 0.1–0.9)
NEUTROS PCT: 66 %
Neutrophils Absolute: 3.9 10*3/uL (ref 1.4–7.0)
Platelets: 189 10*3/uL (ref 150–379)
RBC: 5.28 x10E6/uL (ref 4.14–5.80)
RDW: 13.1 % (ref 12.3–15.4)
WBC: 5.8 10*3/uL (ref 3.4–10.8)

## 2017-03-26 LAB — TSH: TSH: 1.65 u[IU]/mL (ref 0.450–4.500)

## 2017-04-02 ENCOUNTER — Ambulatory Visit: Payer: Managed Care, Other (non HMO) | Admitting: Family Medicine

## 2017-04-02 ENCOUNTER — Encounter: Payer: Self-pay | Admitting: Nurse Practitioner

## 2017-04-02 ENCOUNTER — Ambulatory Visit (INDEPENDENT_AMBULATORY_CARE_PROVIDER_SITE_OTHER): Payer: Managed Care, Other (non HMO) | Admitting: Nurse Practitioner

## 2017-04-02 VITALS — BP 115/78 | HR 78 | Temp 96.9°F | Ht 71.0 in | Wt 180.0 lb

## 2017-04-02 DIAGNOSIS — J40 Bronchitis, not specified as acute or chronic: Secondary | ICD-10-CM | POA: Diagnosis not present

## 2017-04-02 MED ORDER — ALBUTEROL SULFATE HFA 108 (90 BASE) MCG/ACT IN AERS: 2.0000 | INHALATION_SPRAY | Freq: Four times a day (QID) | RESPIRATORY_TRACT | 0 refills | Status: DC | PRN

## 2017-04-02 MED ORDER — PREDNISONE 20 MG PO TABS: ORAL_TABLET | ORAL | 0 refills | Status: DC

## 2017-04-02 MED ORDER — METHYLPREDNISOLONE ACETATE 80 MG/ML IJ SUSP
80.0000 mg | Freq: Once | INTRAMUSCULAR | Status: AC
  Administered 2017-04-02: 80 mg via INTRAMUSCULAR

## 2017-04-02 MED ORDER — BENZONATATE 100 MG PO CAPS: 100.0000 mg | ORAL_CAPSULE | Freq: Three times a day (TID) | ORAL | 0 refills | Status: DC | PRN

## 2017-04-02 NOTE — Patient Instructions (Signed)

## 2017-04-02 NOTE — Progress Notes (Signed)
Subjective:    Patient ID: Frederick Williams, male    DOB: 1960/08/17, 56 y.o.   MRN: [id number]  HPI  Patient comes in today c/o chest congestion. Came in 03/20/17 and saw Dr. Liana Williams with gi upset and fever. Was sent home with zpak. Was getting no better and came back in on  03/25/17 and was diagnosed with malise and fatigue. Respiratory symptoms did resolve, but he was feeling foggy headed. She did labs and xray on him and kept him out of work. Chest x ray was normal and all labs are normal. Today he come sin c/o of chest congestion and cough that started yesterday while working outside in cold air. Still has no fever. But feels really tired.   Review of Systems  Constitutional: Positive for fatigue. Negative for chills and fever.  HENT: Positive for congestion. Negative for ear pain, rhinorrhea, sore throat and trouble swallowing.   Respiratory: Positive for cough and wheezing (only when out in cold air). Negative for shortness of breath.   Cardiovascular: Negative.   Gastrointestinal: Negative.   Genitourinary: Negative.   Neurological: Negative.   Psychiatric/Behavioral: Negative.   All other systems reviewed and are negative.      Objective:   Physical Exam  Constitutional: He is oriented to person, place, and time. He appears well-developed and well-nourished. No distress.  HENT:  Right Ear: Hearing, tympanic membrane, external ear and ear canal normal.  Left Ear: Hearing, tympanic membrane, external ear and ear canal normal.  Nose: Mucosal edema and rhinorrhea present. Right sinus exhibits no maxillary sinus tenderness and no frontal sinus tenderness. Left sinus exhibits no maxillary sinus tenderness and no frontal sinus tenderness.  Mouth/Throat: Uvula is midline, oropharynx is clear and moist and mucous membranes are normal.  Eyes: Pupils are equal, round, and reactive to light.  Neck: Normal range of motion. Neck supple.  Cardiovascular: Normal rate and regular rhythm.    Pulmonary/Chest: Effort normal and breath sounds normal.  Dry cough Diminished breath sound in bil lower lobes  Neurological: He is alert and oriented to person, place, and time.  Skin: Skin is warm.  Psychiatric: He has a normal mood and affect. His behavior is normal. Judgment and thought content normal.   BP 115/78   Pulse 78   Temp (!) 96.9 F (36.1 C) (Oral)   Ht 5\' 11"  (1.803 m)   Wt 180 lb (81.6 kg)   SpO2 98%   BMI 25.10 kg/m       Assessment & Plan:  1. Bronchitis 1. Take meds as prescribed 2. Use a cool mist humidifier especially during the winter months and when heat has been humid. 3. Use saline nose sprays frequently 4. Saline irrigations of the nose can be very helpful if done frequently.  * 4X daily for 1 week*  * Use of a nettie pot can be helpful with this. Follow directions with this* 5. Drink plenty of fluids 6. Keep thermostat turn down low 7.For any cough or congestion  Use plain Mucinex- regular strength or max strength is fine   * Children- consult with Pharmacist for dosing 8. For fever or aces or pains- take tylenol or ibuprofen appropriate for age and weight.  * for fevers greater than 101 orally you may alternate ibuprofen and tylenol every  3 hours.   Meds ordered this encounter  Medications  . methylPREDNISolone acetate (DEPO-MEDROL) injection 80 mg  . predniSONE (DELTASONE) 20 MG tablet    Sig: 2 po at  sametime daily for 5 days- start tomorrow    Dispense:  10 tablet    Refill:  0    Order Specific Question:   Supervising Provider    Answer:   VINCENT, CAROL L [[id number]]  . albuterol (PROVENTIL HFA;VENTOLIN HFA) 108 (90 Base) MCG/ACT inhaler    Sig: Inhale 2 puffs into the lungs every 6 (six) hours as needed for wheezing or shortness of breath.    Dispense:  1 Inhaler    Refill:  0    Order Specific Question:   Supervising Provider    Answer:   VINCENT, CAROL L [[id number]]  . benzonatate (TESSALON PERLES) 100 MG capsule    Sig: Take 1 capsule  (100 mg total) by mouth 3 (three) times daily as needed for cough.    Dispense:  20 capsule    Refill:  0    Order Specific Question:   Supervising Provider    Answer:   VINCENT, CAROL L [[id number]]    - methylPREDNISolone acetate (DEPO-MEDROL) injection 80 mg   Frederick Daphine DeutscherMartin, FNP

## 2017-04-05 ENCOUNTER — Telehealth: Payer: Self-pay | Admitting: Pediatrics

## 2017-04-05 ENCOUNTER — Encounter: Payer: Self-pay | Admitting: Pediatrics

## 2017-04-05 ENCOUNTER — Ambulatory Visit (INDEPENDENT_AMBULATORY_CARE_PROVIDER_SITE_OTHER): Payer: Managed Care, Other (non HMO) | Admitting: Pediatrics

## 2017-04-05 VITALS — BP 128/81 | HR 74 | Temp 97.1°F | Resp 22 | Ht 71.0 in | Wt 182.2 lb

## 2017-04-05 DIAGNOSIS — J441 Chronic obstructive pulmonary disease with (acute) exacerbation: Secondary | ICD-10-CM

## 2017-04-05 MED ORDER — PREDNISONE 10 MG PO TABS: ORAL_TABLET | ORAL | 0 refills | Status: DC

## 2017-04-05 MED ORDER — LEVOFLOXACIN 500 MG PO TABS: 500.0000 mg | ORAL_TABLET | Freq: Every day | ORAL | 0 refills | Status: DC

## 2017-04-05 NOTE — Telephone Encounter (Signed)
Pt was seen 3 different times here for chest congestion and he is no better and been on 2 different treatments and is no better. Wants to speak with a nurse about the time frame he should be getting better and he thinks he might have breathed in chemicals when cutting down a tree.

## 2017-04-05 NOTE — Progress Notes (Signed)
  Subjective:   Patient ID: Frederick Williams, male    DOB: August 07, 1960, 56 y.o.   MRN: [id number] CC: Cough; Chest congestion; and Bronchitis  HPI: Frederick Williams is a 56 y.o. male presenting for Cough; Chest congestion; and Bronchitis  Sick for the past two weeks Two weeks before he got sick he was cutting down an oak tree with white fungus on it, tree had recently been tretaed with chemicals for asian beetles He wonders if this could have impacted his illness at all  Was treated with a zpak 2 weeks ago Had some "cobwebs" in brain, not dizzy like vertigo, was having a hard time concentrating on and off throughout this illness Started feeling worse again 3 days ago on the day he tried to return to work Coughing a lot, seemed foggy headed feeling Lots of nasal discharge now Was seen two days ago, giving PO steroids and albuterol Thinks has helped some with his symptoms Bothered that symptoms are still going on, he is not been able to return to work  31 years smoking, quit several years ago 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 Allergies and medications reviewed and updated. Social History   Tobacco Use  Smoking Status Former Smoker  Smokeless Tobacco Former NeurosurgeonUser  . Quit date: 02/02/2011   ROS: Per HPI   Objective:    BP 128/81   Pulse 74   Temp (!) 97.1 F (36.2 C) (Oral)   Resp (!) 22   Ht 5\' 11"  (1.803 m)   Wt 182 lb 3.2 oz (82.6 kg)   SpO2 96%   BMI 25.41 kg/m   Wt Readings from Last 3 Encounters:  04/05/17 182 lb 3.2 oz (82.6 kg)  04/02/17 180 lb (81.6 kg)  03/25/17 180 lb (81.6 kg)    Gen: NAD, alert, cooperative with exam, NCAT EYES: EOMI, no conjunctival injection, or no icterus CV: NRRR, normal S1/S2, no murmur, distal pulses 2+ b/l Resp: Moving air fair, expiratory wheezes throughout, dry cough, comfortable WOB Abd: +BS, soft, NTND. no guarding or  organomegaly Ext: No edema, warm Neuro: Alert and oriented, strength equal b/l UE and LE, coordination grossly normal MSK: normal muscle bulk  Assessment & Plan:  Frederick Williams was seen today for cough, chest congestion and bronchitis.  Diagnoses and all orders for this visit:  COPD exacerbation (HCC) A longer steroid taper given ongoing wheezing Continue albuterol every 6 hours as needed Note given for work Return precautions discussed -     levofloxacin (LEVAQUIN) 500 MG tablet; Take 1 tablet (500 mg total) by mouth daily. -     predniSONE (DELTASONE) 10 MG tablet; Take 4 tabs for 3 days, 3 for 3 days, 2, then 1 for three days each  I spent 25 minutes with the patient with over 50% of the encounter time dedicated to counseling on the above problems.  Follow up plan: 3 months, sooner as needed Rex Krasarol Estephanie Hubbs, MD [facility]

## 2017-04-05 NOTE — Telephone Encounter (Signed)
Apt given with Dr. Oswaldo DoneVincent today 11:30.

## 2017-04-05 NOTE — Patient Instructions (Addendum)
Albuterol at least three times a day, use with spacer  Nasal congestion: Netipot with distilled water 2-3 times a day to clear out sinuses Or Normal saline nasal spray Flonase steroid nasal spray  Take daily anti-histamine  For sore throat can use: Ibuprofen 400- 600mg  three times a day Throat lozenges chloroseptic spray  Stick with bland foods Drink lots of fluids

## 2017-04-08 ENCOUNTER — Ambulatory Visit: Payer: Managed Care, Other (non HMO) | Admitting: Pediatrics

## 2017-05-02 ENCOUNTER — Other Ambulatory Visit: Payer: Self-pay | Admitting: Nurse Practitioner

## 2017-05-08 ENCOUNTER — Other Ambulatory Visit: Payer: Self-pay | Admitting: Nurse Practitioner

## 2018-01-21 ENCOUNTER — Ambulatory Visit: Payer: Managed Care, Other (non HMO) | Admitting: Nurse Practitioner

## 2018-01-29 ENCOUNTER — Other Ambulatory Visit: Payer: Self-pay | Admitting: *Deleted

## 2018-01-29 DIAGNOSIS — M5416 Radiculopathy, lumbar region: Secondary | ICD-10-CM

## 2018-02-05 ENCOUNTER — Ambulatory Visit
Admission: RE | Admit: 2018-02-05 | Discharge: 2018-02-05 | Disposition: A | Discharge status: Home / Self Care | Payer: Managed Care, Other (non HMO) | Source: Ambulatory Visit | Attending: *Deleted | Admitting: *Deleted

## 2018-02-05 DIAGNOSIS — M5416 Radiculopathy, lumbar region: Secondary | ICD-10-CM

## 2018-04-10 ENCOUNTER — Ambulatory Visit (INDEPENDENT_AMBULATORY_CARE_PROVIDER_SITE_OTHER): Payer: Managed Care, Other (non HMO)

## 2018-04-10 ENCOUNTER — Encounter: Payer: Self-pay | Admitting: Family Medicine

## 2018-04-10 ENCOUNTER — Ambulatory Visit (INDEPENDENT_AMBULATORY_CARE_PROVIDER_SITE_OTHER): Payer: Managed Care, Other (non HMO) | Admitting: Family Medicine

## 2018-04-10 VITALS — BP 140/93 | HR 73 | Temp 97.0°F | Ht 71.0 in | Wt 184.0 lb

## 2018-04-10 DIAGNOSIS — J209 Acute bronchitis, unspecified: Secondary | ICD-10-CM | POA: Diagnosis not present

## 2018-04-10 DIAGNOSIS — R0989 Other specified symptoms and signs involving the circulatory and respiratory systems: Secondary | ICD-10-CM | POA: Diagnosis not present

## 2018-04-10 LAB — VERITOR FLU A/B WAIVED
INFLUENZA A: NEGATIVE
INFLUENZA B: NEGATIVE

## 2018-04-10 MED ORDER — BENZONATATE 100 MG PO CAPS: 100.0000 mg | ORAL_CAPSULE | Freq: Three times a day (TID) | ORAL | 0 refills | Status: DC | PRN

## 2018-04-10 MED ORDER — PREDNISONE 10 MG PO TABS: ORAL_TABLET | ORAL | 0 refills | Status: DC

## 2018-04-10 MED ORDER — LEVOFLOXACIN 500 MG PO TABS: 500.0000 mg | ORAL_TABLET | Freq: Every day | ORAL | 0 refills | Status: DC

## 2018-04-10 NOTE — Progress Notes (Signed)
BP (!) 140/93   Pulse 73   Temp (!) 97 F (36.1 C) (Oral)   Ht 5\' 11"  (1.803 m)   Wt 184 lb (83.5 kg)   BMI 25.66 kg/m    Subjective:    Patient ID: Frederick Williams, male    DOB: [birth year], 57 y.o.   MRN: [id number]  HPI: Frederick Williams is a 57 y.o. male presenting on 04/10/2018 for lung pain (Patient states it is not chest pain but "lung pain". Patient states it has been going on since 7am yesterday.)   HPI Cough and chest congestion and chest tightness Patient is coming in with cough and chest congestion that has been developing since yesterday and he started having this tightness across his chest especially when he deep breathes or coughs where he feels his pain across his whole chest.  He says he has not had any flutters and does not feel like it is his heart but feels like it is more with breathing and coughing.  Patient does have a history of COPD.  Relevant past medical, surgical, family and social history reviewed and updated as indicated. Interim medical history since our last visit reviewed. Allergies and medications reviewed and updated.  Review of Systems  Constitutional: Negative for chills and fever.  HENT: Positive for congestion, postnasal drip, rhinorrhea, sinus pressure, sneezing and sore throat. Negative for ear discharge, ear pain and voice change.   Eyes: Negative for pain, discharge, redness and visual disturbance.  Respiratory: Positive for cough and wheezing. Negative for chest tightness and shortness of breath.   Cardiovascular: Negative for chest pain and leg swelling.  Musculoskeletal: Negative for gait problem.  Skin: Negative for rash.  All other systems reviewed and are negative.   Per HPI unless specifically indicated above   Allergies as of 04/10/2018      Reactions   Ciprofloxacin    Tendon pain   Penicillins Rash      Medication List        Accurate as of 04/10/18 11:07 AM. Always use your most recent med list.          benzonatate 100  MG capsule Commonly known as:  TESSALON Take 1 capsule (100 mg total) by mouth 3 (three) times daily as needed for cough.   levofloxacin 500 MG tablet Commonly known as:  LEVAQUIN Take 1 tablet (500 mg total) by mouth daily.   predniSONE 10 MG tablet Commonly known as:  DELTASONE Take 4 tabs for 3 days, 3 for 3 days, 2, then 1 for three days each          Objective:    BP (!) 140/93   Pulse 73   Temp (!) 97 F (36.1 C) (Oral)   Ht 5\' 11"  (1.803 m)   Wt 184 lb (83.5 kg)   BMI 25.66 kg/m   Wt Readings from Last 3 Encounters:  04/10/18 184 lb (83.5 kg)  04/05/17 182 lb 3.2 oz (82.6 kg)  04/02/17 180 lb (81.6 kg)    Physical Exam  Constitutional: He is oriented to person, place, and time. He appears well-developed and well-nourished. No distress.  HENT:  Right Ear: Tympanic membrane, external ear and ear canal normal.  Left Ear: Tympanic membrane, external ear and ear canal normal.  Nose: Mucosal edema and rhinorrhea present. No sinus tenderness. No epistaxis. Right sinus exhibits maxillary sinus tenderness. Right sinus exhibits no frontal sinus tenderness. Left sinus exhibits maxillary sinus tenderness. Left sinus exhibits no frontal sinus  tenderness.  Mouth/Throat: Uvula is midline and mucous membranes are normal. Posterior oropharyngeal edema and posterior oropharyngeal erythema present. No oropharyngeal exudate or tonsillar abscesses.  Eyes: Conjunctivae are normal. No scleral icterus.  Neck: Neck supple. No thyromegaly present.  Cardiovascular: Normal rate, regular rhythm, normal heart sounds and intact distal pulses.  No murmur heard. Pulmonary/Chest: Effort normal and breath sounds normal. No respiratory distress. He has no wheezes. He has no rales.  Musculoskeletal: Normal range of motion. He exhibits no edema.  Lymphadenopathy:    He has no cervical adenopathy.  Neurological: He is alert and oriented to person, place, and time. Coordination normal.  Skin: Skin is  warm and dry. No rash noted. He is not diaphoretic.  Psychiatric: He has a normal mood and affect. His behavior is normal.  Nursing note and vitals reviewed.   Chest x-ray: No acute cardiopulmonary abnormalities, await final read from radiology    Assessment & Plan:   Problem List Items Addressed This Visit    None    Visit Diagnoses    Acute bronchitis, unspecified organism    -  Primary   Relevant Medications   benzonatate (TESSALON PERLES) 100 MG capsule   levofloxacin (LEVAQUIN) 500 MG tablet   predniSONE (DELTASONE) 10 MG tablet   Other Relevant Orders   DG Chest 2 View   Veritor Flu A/B Waived       Follow up plan: Return if symptoms worsen or fail to improve.  Counseling provided for all of the vaccine components Orders Placed This Encounter  Procedures  . DG Chest 2 View  . Veritor Flu A/B Reynolds Bowl, MD [facility] 04/10/2018, 11:07 AM

## 2018-09-11 ENCOUNTER — Telehealth: Payer: Self-pay | Admitting: Family Medicine

## 2019-03-22 ENCOUNTER — Emergency Department (HOSPITAL_COMMUNITY)
Admission: EM | Admit: 2019-03-22 | Discharge: 2019-03-22 | Discharge status: Against Medical Advice | Payer: 59 | Source: Home / Self Care | Attending: Emergency Medicine | Admitting: Emergency Medicine

## 2019-03-22 ENCOUNTER — Encounter (HOSPITAL_COMMUNITY): Payer: Self-pay | Admitting: *Deleted

## 2019-03-22 ENCOUNTER — Emergency Department (HOSPITAL_COMMUNITY): Payer: 59

## 2019-03-22 ENCOUNTER — Other Ambulatory Visit: Payer: Self-pay

## 2019-03-22 DIAGNOSIS — Z87891 Personal history of nicotine dependence: Secondary | ICD-10-CM | POA: Insufficient documentation

## 2019-03-22 DIAGNOSIS — U071 COVID-19: Secondary | ICD-10-CM | POA: Insufficient documentation

## 2019-03-22 DIAGNOSIS — R0682 Tachypnea, not elsewhere classified: Secondary | ICD-10-CM | POA: Diagnosis not present

## 2019-03-22 NOTE — ED Provider Notes (Signed)
[facility] EMERGENCY DEPARTMENT Provider Note   CSN: [id number] Arrival date & time: 03/22/19  1208     History   Chief Complaint Chief Complaint  Patient presents with  . covid    HPI Frederick Williams is a 58 y.o. male.     The history is provided by the patient. No language interpreter was used.  Cough Cough characteristics:  Non-productive Sputum characteristics:  Nondescript Severity:  Severe Onset quality:  Gradual Duration:  1 week Timing:  Constant Progression:  Worsening Chronicity:  New Context: sick contacts   Context: not smoke exposure   Relieved by:  Nothing Worsened by:  Nothing Ineffective treatments:  None tried Associated symptoms: fever, myalgias and shortness of breath   Associated symptoms: no chest pain    Pt reports he tested positive for covid.  Pt states he was told that there is a medicine to treat it and wants to get the medication.  Pt states he was told that [facility] has it  History reviewed. No pertinent past medical history.  There are no active problems to display for this patient.   History reviewed. No pertinent surgical history.      Home Medications    Prior to Admission medications   Medication Sig Start Date End Date Taking? Authorizing Provider  benzonatate (TESSALON PERLES) 100 MG capsule Take 1 capsule (100 mg total) by mouth 3 (three) times daily as needed for cough. 04/10/18   Dettinger, Fransisca Kaufmann, MD  levofloxacin (LEVAQUIN) 500 MG tablet Take 1 tablet (500 mg total) by mouth daily. 04/10/18   Dettinger, Fransisca Kaufmann, MD  predniSONE (DELTASONE) 10 MG tablet Take 4 tabs for 3 days, 3 for 3 days, 2, then 1 for three days each 04/10/18   Dettinger, Fransisca Kaufmann, MD    Family History Family History  Problem Relation Age of Onset  . Dementia Mother   . Hypertension Sister     Social History Social History   Tobacco Use  . Smoking status: Former Research scientist (life sciences)  . Smokeless tobacco: Former Systems developer    Quit date: 02/02/2011  Substance Use  Topics  . Alcohol use: Yes  . Drug use: No     Allergies   Ciprofloxacin and Penicillins   Review of Systems Review of Systems  Constitutional: Positive for fever.  Respiratory: Positive for cough and shortness of breath.   Cardiovascular: Negative for chest pain.  Musculoskeletal: Positive for myalgias.     Physical Exam Updated Vital Signs BP 135/87   Pulse (!) 106   Temp 99.1 F (37.3 C) (Oral)   Resp 18   Ht 6' (1.829 m)   Wt 83.9 kg   SpO2 94%   BMI 25.09 kg/m   Physical Exam Vitals signs and nursing note reviewed.  Constitutional:      Appearance: He is well-developed.  HENT:     Head: Normocephalic.     Nose: Nose normal.  Neck:     Musculoskeletal: Normal range of motion.  Cardiovascular:     Rate and Rhythm: Normal rate and regular rhythm.     Pulses: Normal pulses.  Pulmonary:     Effort: Pulmonary effort is normal.  Abdominal:     General: Abdomen is flat. There is no distension.  Musculoskeletal: Normal range of motion.  Skin:    General: Skin is warm.  Neurological:     General: No focal deficit present.     Mental Status: He is alert and oriented to person, place, and time.  Psychiatric:        Mood and Affect: Mood normal.      ED Treatments / Results  Labs (all labs ordered are listed, but only abnormal results are displayed) Labs Reviewed - No data to display  EKG None  Radiology No results found.  Procedures Procedures (including critical care time)  Medications Ordered in ED Medications - No data to display   Initial Impression / Assessment and Plan / ED Course  I have reviewed the triage vital signs and the nursing notes.  Pertinent labs & imaging results that were available during my care of the patient were reviewed by me and considered in my medical decision making (see chart for details).        MDM Pt left before xray results returned.  Pt's 02 sats 92-96%.  Chest xray shows viral changes consistent with  Covid.   Final Clinical Impressions(s) / ED Diagnoses   Final diagnoses:  COVID-19    ED Discharge Orders    None    AMA    Osie Cheeks 03/22/19 1530    Donnetta Hutching, MD 03/25/19 [phone]

## 2019-03-22 NOTE — ED Triage Notes (Signed)
Pt tested on Monday and was positive, pt c/o chills, cough, loss of taste, body aches and HA.  Last took ibuprofen at 0730 and tylenol at 1000.

## 2019-03-22 NOTE — ED Notes (Signed)
Pt appears terse, angry, and anxious

## 2019-03-22 NOTE — ED Notes (Signed)
Pt stormed out saying "you all aren't going to do nothing, I'm done"

## 2019-03-22 NOTE — ED Notes (Signed)
Pt awaiting rad result and had been evaluated by KS, PA

## 2019-03-22 NOTE — ED Notes (Signed)
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

## 2019-03-22 NOTE — ED Notes (Signed)
KS is aware of pt elopement

## 2019-03-24 ENCOUNTER — Other Ambulatory Visit: Payer: Self-pay

## 2019-03-24 ENCOUNTER — Encounter (HOSPITAL_COMMUNITY): Payer: Self-pay | Admitting: Emergency Medicine

## 2019-03-24 ENCOUNTER — Inpatient Hospital Stay (HOSPITAL_COMMUNITY)
Admission: EM | Admit: 2019-03-24 | Discharge: 2019-03-29 | DRG: 177 | Disposition: A | Discharge status: Home / Self Care | Payer: 59 | Attending: Internal Medicine | Admitting: Internal Medicine

## 2019-03-24 ENCOUNTER — Emergency Department (HOSPITAL_COMMUNITY): Payer: 59

## 2019-03-24 DIAGNOSIS — J1289 Other viral pneumonia: Secondary | ICD-10-CM | POA: Diagnosis present

## 2019-03-24 DIAGNOSIS — N179 Acute kidney failure, unspecified: Secondary | ICD-10-CM | POA: Diagnosis present

## 2019-03-24 DIAGNOSIS — J9601 Acute respiratory failure with hypoxia: Secondary | ICD-10-CM | POA: Diagnosis present

## 2019-03-24 DIAGNOSIS — E440 Moderate protein-calorie malnutrition: Secondary | ICD-10-CM | POA: Diagnosis present

## 2019-03-24 DIAGNOSIS — J209 Acute bronchitis, unspecified: Secondary | ICD-10-CM

## 2019-03-24 DIAGNOSIS — Z888 Allergy status to other drugs, medicaments and biological substances status: Secondary | ICD-10-CM

## 2019-03-24 DIAGNOSIS — R0682 Tachypnea, not elsewhere classified: Secondary | ICD-10-CM

## 2019-03-24 DIAGNOSIS — U071 COVID-19: Principal | ICD-10-CM | POA: Diagnosis present

## 2019-03-24 DIAGNOSIS — Z87891 Personal history of nicotine dependence: Secondary | ICD-10-CM | POA: Diagnosis not present

## 2019-03-24 DIAGNOSIS — Z79899 Other long term (current) drug therapy: Secondary | ICD-10-CM

## 2019-03-24 DIAGNOSIS — Z880 Allergy status to penicillin: Secondary | ICD-10-CM | POA: Diagnosis not present

## 2019-03-24 DIAGNOSIS — J1282 Pneumonia due to coronavirus disease 2019: Secondary | ICD-10-CM | POA: Diagnosis present

## 2019-03-24 DIAGNOSIS — K219 Gastro-esophageal reflux disease without esophagitis: Secondary | ICD-10-CM | POA: Diagnosis present

## 2019-03-24 DIAGNOSIS — Z6824 Body mass index (BMI) 24.0-24.9, adult: Secondary | ICD-10-CM

## 2019-03-24 LAB — COMPREHENSIVE METABOLIC PANEL
ALT: 35 U/L (ref 0–44)
AST: 40 U/L (ref 15–41)
Albumin: 3.7 g/dL (ref 3.5–5.0)
Alkaline Phosphatase: 54 U/L (ref 38–126)
Anion gap: 10 (ref 5–15)
BUN: 16 mg/dL (ref 6–20)
CO2: 25 mmol/L (ref 22–32)
Calcium: 8.3 mg/dL — ABNORMAL LOW (ref 8.9–10.3)
Chloride: 101 mmol/L (ref 98–111)
Creatinine, Ser: 1.38 mg/dL — ABNORMAL HIGH (ref 0.61–1.24)
GFR calc Af Amer: >60 mL/min (ref >60)
GFR calc non Af Amer: 56 mL/min — ABNORMAL LOW (ref >60)
Glucose, Bld: 97 mg/dL (ref 70–99)
Potassium: 4.5 mmol/L (ref 3.5–5.1)
Sodium: 136 mmol/L (ref 135–145)
Total Bilirubin: 0.7 mg/dL (ref 0.3–1.2)
Total Protein: 7.1 g/dL (ref 6.5–8.1)

## 2019-03-24 LAB — CREATININE, SERUM
Creatinine, Ser: 1.18 mg/dL (ref 0.61–1.24)
GFR calc Af Amer: >60 mL/min (ref >60)
GFR calc non Af Amer: >60 mL/min (ref >60)

## 2019-03-24 LAB — CBC WITH DIFFERENTIAL/PLATELET
Abs Immature Granulocytes: 0.04 10*3/uL (ref 0.00–0.07)
Basophils Absolute: 0 10*3/uL (ref 0.0–0.1)
Basophils Relative: 0 %
Eosinophils Absolute: 0 10*3/uL (ref 0.0–0.5)
Eosinophils Relative: 0 %
HCT: 51.6 % (ref 39.0–52.0)
Hemoglobin: 17.4 g/dL — ABNORMAL HIGH (ref 13.0–17.0)
Immature Granulocytes: 1 %
Lymphocytes Relative: 17 %
Lymphs Abs: 0.9 10*3/uL (ref 0.7–4.0)
MCH: 31.6 pg (ref 26.0–34.0)
MCHC: 33.7 g/dL (ref 30.0–36.0)
MCV: 93.6 fL (ref 80.0–100.0)
Monocytes Absolute: 0.6 10*3/uL (ref 0.1–1.0)
Monocytes Relative: 11 %
Neutro Abs: 3.7 10*3/uL (ref 1.7–7.7)
Neutrophils Relative %: 71 %
Platelets: 143 10*3/uL — ABNORMAL LOW (ref 150–400)
RBC: 5.51 MIL/uL (ref 4.22–5.81)
RDW: 11.7 % (ref 11.5–15.5)
WBC: 5.1 10*3/uL (ref 4.0–10.5)
nRBC: 0 % (ref 0.0–0.2)

## 2019-03-24 LAB — LACTIC ACID, PLASMA
Lactic Acid, Venous: 1.4 mmol/L (ref 0.5–1.9)
Lactic Acid, Venous: 1.4 mmol/L (ref 0.5–1.9)

## 2019-03-24 LAB — C-REACTIVE PROTEIN: CRP: 11 mg/dL — ABNORMAL HIGH (ref <1.0)

## 2019-03-24 LAB — CBC
HCT: 49 % (ref 39.0–52.0)
Hemoglobin: 16.5 g/dL (ref 13.0–17.0)
MCH: 31.7 pg (ref 26.0–34.0)
MCHC: 33.7 g/dL (ref 30.0–36.0)
MCV: 94.2 fL (ref 80.0–100.0)
Platelets: 140 10*3/uL — ABNORMAL LOW (ref 150–400)
RBC: 5.2 MIL/uL (ref 4.22–5.81)
RDW: 11.7 % (ref 11.5–15.5)
WBC: 5.4 10*3/uL (ref 4.0–10.5)
nRBC: 0 % (ref 0.0–0.2)

## 2019-03-24 LAB — LACTATE DEHYDROGENASE: LDH: 259 U/L — ABNORMAL HIGH (ref 98–192)

## 2019-03-24 LAB — TROPONIN I (HIGH SENSITIVITY)
Troponin I (High Sensitivity): 4 ng/L (ref <18)
Troponin I (High Sensitivity): 4 ng/L (ref <18)

## 2019-03-24 LAB — PROCALCITONIN: Procalcitonin: <0.1 ng/mL

## 2019-03-24 LAB — D-DIMER, QUANTITATIVE: D-Dimer, Quant: 0.93 ug/mL-FEU — ABNORMAL HIGH (ref 0.00–0.50)

## 2019-03-24 LAB — FERRITIN: Ferritin: 1735 ng/mL — ABNORMAL HIGH (ref 24–336)

## 2019-03-24 LAB — TRIGLYCERIDES: Triglycerides: 126 mg/dL (ref <150)

## 2019-03-24 LAB — FIBRINOGEN: Fibrinogen: 742 mg/dL — ABNORMAL HIGH (ref 210–475)

## 2019-03-24 MED ORDER — PANTOPRAZOLE SODIUM 40 MG IV SOLR
40.0000 mg | Freq: Two times a day (BID) | INTRAVENOUS | Status: DC
  Administered 2019-03-25 (×3): 40 mg via INTRAVENOUS
  Filled 2019-03-24 (×4): qty 40

## 2019-03-24 MED ORDER — HYDROCOD POLST-CPM POLST ER 10-8 MG/5ML PO SUER
5.0000 mL | Freq: Two times a day (BID) | ORAL | Status: DC | PRN
  Administered 2019-03-25: 5 mL via ORAL
  Filled 2019-03-24: qty 5

## 2019-03-24 MED ORDER — VITAMIN C 500 MG PO TABS
500.0000 mg | ORAL_TABLET | Freq: Every day | ORAL | Status: DC
  Administered 2019-03-24 – 2019-03-29 (×6): 500 mg via ORAL
  Filled 2019-03-24 (×6): qty 1

## 2019-03-24 MED ORDER — ZINC SULFATE 220 (50 ZN) MG PO CAPS
220.0000 mg | ORAL_CAPSULE | Freq: Every day | ORAL | Status: DC
  Administered 2019-03-24 – 2019-03-29 (×6): 220 mg via ORAL
  Filled 2019-03-24 (×6): qty 1

## 2019-03-24 MED ORDER — DEXAMETHASONE SODIUM PHOSPHATE 10 MG/ML IJ SOLN
6.0000 mg | INTRAMUSCULAR | Status: DC
  Administered 2019-03-24 – 2019-03-28 (×5): 6 mg via INTRAVENOUS
  Filled 2019-03-24 (×3): qty 1
  Filled 2019-03-24: qty 0.6
  Filled 2019-03-24: qty 1

## 2019-03-24 MED ORDER — HYDROCODONE-ACETAMINOPHEN 5-325 MG PO TABS
1.0000 | ORAL_TABLET | ORAL | Status: DC | PRN
  Administered 2019-03-27 (×2): 2 via ORAL
  Filled 2019-03-24 (×2): qty 2

## 2019-03-24 MED ORDER — GUAIFENESIN-DM 100-10 MG/5ML PO SYRP
10.0000 mL | ORAL_SOLUTION | ORAL | Status: DC | PRN
  Administered 2019-03-24 – 2019-03-27 (×2): 10 mL via ORAL
  Filled 2019-03-24 (×2): qty 10

## 2019-03-24 MED ORDER — ENOXAPARIN SODIUM 40 MG/0.4ML ~~LOC~~ SOLN
40.0000 mg | SUBCUTANEOUS | Status: DC
  Administered 2019-03-24 – 2019-03-28 (×5): 40 mg via SUBCUTANEOUS
  Filled 2019-03-24 (×5): qty 0.4

## 2019-03-24 MED ORDER — ALBUTEROL SULFATE HFA 108 (90 BASE) MCG/ACT IN AERS
1.0000 | INHALATION_SPRAY | RESPIRATORY_TRACT | Status: DC | PRN
  Filled 2019-03-24: qty 6.7

## 2019-03-24 MED ORDER — ONDANSETRON HCL 4 MG/2ML IJ SOLN: 4.0000 mg | Freq: Four times a day (QID) | INTRAMUSCULAR | Status: DC | PRN

## 2019-03-24 MED ORDER — SODIUM CHLORIDE 0.9 % IV BOLUS
500.0000 mL | Freq: Once | INTRAVENOUS | Status: AC
  Administered 2019-03-24: 14:00:00 500 mL via INTRAVENOUS

## 2019-03-24 MED ORDER — ONDANSETRON HCL 4 MG PO TABS: 4.0000 mg | ORAL_TABLET | Freq: Four times a day (QID) | ORAL | Status: DC | PRN

## 2019-03-24 MED ORDER — PANTOPRAZOLE SODIUM 40 MG IV SOLR: 40.0000 mg | Freq: Every day | INTRAVENOUS | Status: DC

## 2019-03-24 NOTE — ED Provider Notes (Signed)
Emergency Department Provider Note   I have reviewed the triage vital signs and the nursing notes.   HISTORY  Chief Complaint Covid+ and Shortness of Breath   HPI Frederick Williams is a 58 y.o. male with past history of hypertension and COVID 19 diagnosis 5 days prior presents to the emergency department with worsening shortness of breath, chest tightness, fatigue, body aches.  Patient is a former smoker.  He states that since he was diagnosed with Covid his symptoms have been gradually worsening.  He is unable to sleep.  He feels short of breath both at rest and especially with exertion.  He is having frequent dry cough along with headache.  No vomiting or diarrhea.  He was given a course of prednisone which she completed yesterday with no relief in symptoms.  He saw his primary care physician today who referred him to the emergency department given his SOB. No radiation of symptoms.   History reviewed. No pertinent past medical history.  There are no active problems to display for this patient.   History reviewed. No pertinent surgical history.  Allergies Ciprofloxacin and Penicillins  Family History  Problem Relation Age of Onset  . Dementia Mother   . Hypertension Sister     Social History Social History   Tobacco Use  . Smoking status: Former Research scientist (life sciences)  . Smokeless tobacco: Former Systems developer    Quit date: 02/02/2011  Substance Use Topics  . Alcohol use: Yes  . Drug use: No    Review of Systems  Constitutional: Positive chills and body aches.  Eyes: No visual changes. ENT: No sore throat. Cardiovascular: Positive chest pain. Respiratory: Positive shortness of breath. Gastrointestinal: No abdominal pain.  No nausea, no vomiting.  No diarrhea.  No constipation. Genitourinary: Negative for dysuria. Musculoskeletal: Negative for back pain. Skin: Negative for rash. Neurological: Negative for focal weakness or numbness. Positive HA.   10-point ROS otherwise negative.   ____________________________________________   PHYSICAL EXAM:  VITAL SIGNS: ED Triage Vitals  Enc Vitals Group     BP 03/24/19 1310 (!) 133/94     Pulse Rate 03/24/19 1310 83     Resp 03/24/19 1310 (!) 22     Temp 03/24/19 1310 97.8 F (36.6 C)     Temp Source 03/24/19 1310 Oral     SpO2 03/24/19 1310 96 %     Weight 03/24/19 1315 174 lb (78.9 kg)     Height 03/24/19 1315 6' (1.829 m)   Constitutional: Alert and oriented. Well appearing and in no acute distress. Eyes: Conjunctivae are normal.  Head: Atraumatic. Nose: No congestion/rhinnorhea. Mouth/Throat: Mucous membranes are moist.  Neck: No stridor.   Cardiovascular: Normal rate, regular rhythm. Good peripheral circulation. Grossly normal heart sounds.   Respiratory: Increased respiratory effort with tachypnea.  No retractions. Lungs CTAB. Gastrointestinal: Soft and nontender. No distention.  Musculoskeletal: No gross deformities of extremities. Neurologic:  Normal speech and language.  Skin:  Skin is warm, dry and intact. No rash noted.   ____________________________________________   LABS (all labs ordered are listed, but only abnormal results are displayed)  Labs Reviewed  CBC WITH DIFFERENTIAL/PLATELET - Abnormal; Notable for the following components:      Result Value   Hemoglobin 17.4 (*)    Platelets 143 (*)    All other components within normal limits  COMPREHENSIVE METABOLIC PANEL - Abnormal; Notable for the following components:   Creatinine, Ser 1.38 (*)    Calcium 8.3 (*)    GFR  calc non Af Amer 56 (*)    All other components within normal limits  D-DIMER, QUANTITATIVE (NOT AT [facility]) - Abnormal; Notable for the following components:   D-Dimer, Quant 0.93 (*)    All other components within normal limits  LACTATE DEHYDROGENASE - Abnormal; Notable for the following components:   LDH 259 (*)    All other components within normal limits  FERRITIN - Abnormal; Notable for the following components:    Ferritin 1,735 (*)    All other components within normal limits  FIBRINOGEN - Abnormal; Notable for the following components:   Fibrinogen 742 (*)    All other components within normal limits  C-REACTIVE PROTEIN - Abnormal; Notable for the following components:   CRP 11.0 (*)    All other components within normal limits  CULTURE, BLOOD (ROUTINE X 2)  CULTURE, BLOOD (ROUTINE X 2)  LACTIC ACID, PLASMA  LACTIC ACID, PLASMA  PROCALCITONIN  TRIGLYCERIDES  TROPONIN I (HIGH SENSITIVITY)  TROPONIN I (HIGH SENSITIVITY)   ____________________________________________  EKG   EKG Interpretation  Date/Time:  Tuesday March 24 2019 13:10:05 EST Ventricular Rate:  87 PR Interval:    QRS Duration: 82 QT Interval:  358 QTC Calculation: 431 R Axis:   50 Text Interpretation: Sinus rhythm No STEMI Confirmed by Alona BeneLong, Joshua [phone]([id number]) on 03/24/2019 1:18:32 PM       ____________________________________________  RADIOLOGY  Dg Chest Port 1 View  Result Date: 03/24/2019 CLINICAL DATA:  Chest pain, shortness of breath EXAM: PORTABLE CHEST 1 VIEW COMPARISON:  03/22/2019 FINDINGS: No significant change in heterogeneous bilateral pulmonary opacity. The heart and mediastinum are unremarkable. IMPRESSION: No significant change in heterogeneous bilateral pulmonary opacity, in keeping with reported COVID-19 diagnosis. Electronically Signed   By: Lauralyn PrimesAlex  Bibbey M.D.   On: 03/24/2019 13:55    ____________________________________________   PROCEDURES  Procedure(s) performed:   Procedures  CRITICAL CARE Performed by: Maia PlanJoshua G Long Total critical care time: 35 minutes Critical care time was exclusive of separately billable procedures and treating other patients. Critical care was necessary to treat or prevent imminent or life-threatening deterioration. Critical care was time spent personally by me on the following activities: development of treatment plan with patient and/or surrogate as well as  nursing, discussions with consultants, evaluation of patient's response to treatment, examination of patient, obtaining history from patient or surrogate, ordering and performing treatments and interventions, ordering and review of laboratory studies, ordering and review of radiographic studies, pulse oximetry and re-evaluation of patient's condition.  Alona BeneJoshua Long, MD Emergency Medicine  ____________________________________________   INITIAL IMPRESSION / ASSESSMENT AND PLAN / ED COURSE  Pertinent labs & imaging results that were available during my care of the patient were reviewed by me and considered in my medical decision making (see chart for details).   Patient presents to the emergency department for evaluation of worsening symptoms from COVID-19 infection.  Chest x-ray reviewed which shows bilateral pulmonary opacities which are unchanged from his recent chest x-ray.  Patient is not hypoxic at rest but very tachypneic.  Do not appreciate any wheezing.  I will ambulate the patient on pulse ox and obtain labs including troponin with CP symptoms.   Lab work resulting with normal lactate.  Patient ambulated but required significant assistance with tachypnea and assistance back to bed but no hypoxemia. Labs reviewed.   Discussed patient's case with [facility] to request admission. Patient and family (if present) updated with plan. [facility]  I reviewed all nursing notes, vitals,  pertinent old records, EKGs, labs, imaging (as available).  ____________________________________________  FINAL CLINICAL IMPRESSION(S) / ED DIAGNOSES  Final diagnoses:  COVID-19  Tachypnea     MEDICATIONS GIVEN DURING THIS VISIT:  Medications  sodium chloride 0.9 % bolus 500 mL (0 mLs Intravenous Stopped 03/24/19 1522)     Note:  This document was prepared using Dragon voice recognition software and may include unintentional dictation errors.  Alona Bene, MD, [facility] Emergency Medicine     Long, Arlyss Repress, MD 03/24/19 [phone]

## 2019-03-24 NOTE — ED Triage Notes (Signed)
Pt was positive for Covid-19 on 11/13. [t was seen at [facility] ED on 11/15 and states "they didn nothing for me. Everyone just tells me to go home and I will get better". Pt c/o SOB with exertion, cough, fatigue, no appetite and head pressure. Pt c/o chest pressure now.  Pt was seen at [facility] and was told dehydrated and to go to ED.  Reports that he was being treated for bronchitis before Covid results came back, and once was positive "they never changed my medications".

## 2019-03-24 NOTE — ED Notes (Signed)
Pt ambulated around in room. Pt O2 saturation maintained between 95-100%. Pt did get very SOB and dizzy with ambulating around room. Respirations got up to 30/minute and had to sit down.

## 2019-03-24 NOTE — H&P (Signed)
History and Physical    Frederick Williams JSE:[id number] DOB: May 19, 1960 DOA: 03/24/2019  PCP: Dettinger, Fransisca Kaufmann, MD  Patient coming from: home  I have personally briefly reviewed patient's old medical records in [place]  Chief Complaint: Shortness of breath/dyspnea on exertion  HPI: Frederick Williams is a 58 y.o. male with without prior medical history, no chronic medications who presents with worsening shortness of breath cough and fevers in the setting of known COVID-19 pneumonia.  Patient reports he was in his usual state of health up until last Monday little over week ago.  He states at that time he started to begin developing symptoms of shortness of breath, cough, fevers and general lethargy and fatigue.  He states he try to go to work 1 day and then have to call out because he felt so poor.  He was subsequently followed by [facility] and was placed on prednisone and Levaquin.  He states his symptoms have progressively gotten worse last couple days prompting him to come in.  He reports that he has been having anosmia as well.  He denies any diarrhea.  He states that he has a lot of chest pain whenever he has coughing spells which seem to be quite frequent.  He states the prednisone he could not tolerate because he had severe reflux.  He has no history of blood clots.  Patient is a former smoker quit 6 to 7 years ago.  He does drink but reports that he drinks about 2 beers a night, has never gone through withdrawal and has not had anything to drink for over a week now.  He denies any other drug use.  For work, he drove tractor trailers.  He believes he contracted Covid from a fellow employee who is much younger and did not have as terrible symptoms but was confirmed positive.  He lives at home with his wife who has COPD and is aware of his diagnosis.  Review of Systems: As per HPI otherwise 10 point review of systems negative.   History reviewed. No pertinent past medical history.   History reviewed. No pertinent surgical history.   reports that he has quit smoking. He quit smokeless tobacco use about 8 years ago. He reports current alcohol use. He reports that he does not use drugs.  Allergies  Allergen Reactions  . Ciprofloxacin     Tendon pain  . Penicillins Rash    Family History  Problem Relation Age of Onset  . Dementia Mother   . Hypertension Sister      Prior to Admission medications   Medication Sig Start Date End Date Taking? Authorizing Provider  HYDROMET 5-1.5 MG/5ML syrup Take 5 mLs by mouth every 4 (four) hours. 03/16/19  Yes [provider]  predniSONE (DELTASONE) 10 MG tablet Take 4 tabs for 3 days, 3 for 3 days, 2, then 1 for three days each 04/10/18  Yes Dettinger, Fransisca Kaufmann, MD  benzonatate (TESSALON PERLES) 100 MG capsule Take 1 capsule (100 mg total) by mouth 3 (three) times daily as needed for cough. Patient not taking: Reported on 03/24/2019 04/10/18   Dettinger, Fransisca Kaufmann, MD  levofloxacin (LEVAQUIN) 500 MG tablet Take 1 tablet (500 mg total) by mouth daily. Patient not taking: Reported on 03/24/2019 04/10/18   Dettinger, Fransisca Kaufmann, MD    Physical Exam: Vitals:   03/24/19 1330 03/24/19 1400 03/24/19 1621 03/24/19 1636  BP: (!) 125/96 127/90 115/79   Pulse: 85 84 93   Resp: Marland Kitchen)  21 (!) 22 18   Temp:    99.2 F (37.3 C)  TempSrc:    Oral  SpO2: 96% 97% 97%   Weight:      Height:        Constitutional: NAD, calm, comfortable Vitals:   03/24/19 1330 03/24/19 1400 03/24/19 1621 03/24/19 1636  BP: (!) 125/96 127/90 115/79   Pulse: 85 84 93   Resp: (!) 21 (!) 22 18   Temp:    99.2 F (37.3 C)  TempSrc:    Oral  SpO2: 96% 97% 97%   Weight:      Height:       General: Pleasant but acutely in respiratory distress, pain from coughing Eyes: PERRL, lids and conjunctivae normal, EOMI ENMT: Mucous membranes are moist. Posterior pharynx clear of any exudate or lesions.Normal dentition.  Neck: normal, supple, no masses, no  thyromegaly Respiratory: Shallow breathing, no wheezing, unable to appreciate crackles (exam limited by PPE) Cardiovascular: Tachycardic rate and rhythm, no murmurs / rubs / gallops. No extremity edema. 2+ pedal pulses.  Abdomen: no tenderness, no masses palpated. No hepatosplenomegaly. Bowel sounds positive.  Musculoskeletal: no clubbing / cyanosis. No joint deformity upper and lower extremities. Good ROM, no contractures. Normal muscle tone.  Skin: no rashes, lesions, ulcers. No induration Neurologic: CN 2-12 grossly intact.  Strength and sensation grossly intact Psychiatric: Normal judgment and insight. Alert and oriented x 3. Normal mood.    Labs on Admission: I have personally reviewed following labs and imaging studies  CBC: Recent Labs  Lab 03/24/19 1424  WBC 5.1  NEUTROABS 3.7  HGB 17.4*  HCT 51.6  MCV 93.6  PLT 143*   Basic Metabolic Panel: Recent Labs  Lab 03/24/19 1424  NA 136  K 4.5  CL 101  CO2 25  GLUCOSE 97  BUN 16  CREATININE 1.38*  CALCIUM 8.3*   GFR: Estimated Creatinine Clearance: 64 mL/min (A) (by C-G formula based on SCr of 1.38 mg/dL (H)). Liver Function Tests: Recent Labs  Lab 03/24/19 1424  AST 40  ALT 35  ALKPHOS 54  BILITOT 0.7  PROT 7.1  ALBUMIN 3.7   No results for input(s): LIPASE, AMYLASE in the last 168 hours. No results for input(s): AMMONIA in the last 168 hours. Coagulation Profile: No results for input(s): INR, PROTIME in the last 168 hours. Cardiac Enzymes: No results for input(s): CKTOTAL, CKMB, CKMBINDEX, TROPONINI in the last 168 hours. BNP (last 3 results) No results for input(s): PROBNP in the last 8760 hours. HbA1C: No results for input(s): HGBA1C in the last 72 hours. CBG: No results for input(s): GLUCAP in the last 168 hours. Lipid Profile: Recent Labs    03/24/19 1424  TRIG 126   Thyroid Function Tests: No results for input(s): TSH, T4TOTAL, FREET4, T3FREE, THYROIDAB in the last 72 hours. Anemia Panel:  Recent Labs    03/24/19 1424  FERRITIN 1,735*   Urine analysis: No results found for: COLORURINE, APPEARANCEUR, LABSPEC, PHURINE, GLUCOSEU, HGBUR, BILIRUBINUR, KETONESUR, PROTEINUR, UROBILINOGEN, NITRITE, LEUKOCYTESUR  Radiological Exams on Admission: Dg Chest Port 1 View  Result Date: 03/24/2019 CLINICAL DATA:  Chest pain, shortness of breath EXAM: PORTABLE CHEST 1 VIEW COMPARISON:  03/22/2019 FINDINGS: No significant change in heterogeneous bilateral pulmonary opacity. The heart and mediastinum are unremarkable. IMPRESSION: No significant change in heterogeneous bilateral pulmonary opacity, in keeping with reported COVID-19 diagnosis. Electronically Signed   By: Lauralyn PrimesAlex  Bibbey M.D.   On: 03/24/2019 13:55    EKG: Independently reviewed.  Assessment/Plan  Frederick Williams is a 58 y.o. male with without prior medical history, no chronic medications who presents with worsening shortness of breath cough and fevers in the setting of known COVID-19 pneumonia.  # COVID 19 Pneumonia -Patient tachypneic and tachycardic but without hypoxia even with ambulation.  Patient looks acutely ill. -We will continue dexamethasone IV as patient reports he cannot tolerate p.o. and initiated twice daily PPI to assist with symptoms -We will continue to trend markers daily and follow-up blood cultures -Continue vitamins, inhalers, cough suppressants and supportive care -If patient were to develop oxygen requirement would initiate remdesivir  DVT prophylaxis: Lovenox Code Status: Full Family Communication: Patient has notified his wife, she has COPD and is aware of positive diagnosis. Disposition Plan: Pending Admission status: Admit to [facility] MD [facility] Pager [phone]  If 7PM-7AM, please contact night-coverage [email or web address] Password [facility]  03/24/2019, 5:46 PM

## 2019-03-25 ENCOUNTER — Encounter (HOSPITAL_COMMUNITY): Payer: Self-pay

## 2019-03-25 DIAGNOSIS — J1289 Other viral pneumonia: Secondary | ICD-10-CM

## 2019-03-25 DIAGNOSIS — E440 Moderate protein-calorie malnutrition: Secondary | ICD-10-CM | POA: Insufficient documentation

## 2019-03-25 LAB — CBC WITH DIFFERENTIAL/PLATELET
Abs Immature Granulocytes: 0.03 10*3/uL (ref 0.00–0.07)
Basophils Absolute: 0 10*3/uL (ref 0.0–0.1)
Basophils Relative: 0 %
Eosinophils Absolute: 0 10*3/uL (ref 0.0–0.5)
Eosinophils Relative: 0 %
HCT: 46.9 % (ref 39.0–52.0)
Hemoglobin: 16 g/dL (ref 13.0–17.0)
Immature Granulocytes: 1 %
Lymphocytes Relative: 19 %
Lymphs Abs: 0.5 10*3/uL — ABNORMAL LOW (ref 0.7–4.0)
MCH: 31.7 pg (ref 26.0–34.0)
MCHC: 34.1 g/dL (ref 30.0–36.0)
MCV: 92.9 fL (ref 80.0–100.0)
Monocytes Absolute: 0.3 10*3/uL (ref 0.1–1.0)
Monocytes Relative: 10 %
Neutro Abs: 1.7 10*3/uL (ref 1.7–7.7)
Neutrophils Relative %: 70 %
Platelets: 141 10*3/uL — ABNORMAL LOW (ref 150–400)
RBC: 5.05 MIL/uL (ref 4.22–5.81)
RDW: 11.5 % (ref 11.5–15.5)
WBC: 2.4 10*3/uL — ABNORMAL LOW (ref 4.0–10.5)
nRBC: 0 % (ref 0.0–0.2)

## 2019-03-25 LAB — COMPREHENSIVE METABOLIC PANEL
ALT: 29 U/L (ref 0–44)
AST: 34 U/L (ref 15–41)
Albumin: 3.1 g/dL — ABNORMAL LOW (ref 3.5–5.0)
Alkaline Phosphatase: 49 U/L (ref 38–126)
Anion gap: 10 (ref 5–15)
BUN: 17 mg/dL (ref 6–20)
CO2: 21 mmol/L — ABNORMAL LOW (ref 22–32)
Calcium: 8.1 mg/dL — ABNORMAL LOW (ref 8.9–10.3)
Chloride: 104 mmol/L (ref 98–111)
Creatinine, Ser: 0.99 mg/dL (ref 0.61–1.24)
GFR calc Af Amer: >60 mL/min (ref >60)
GFR calc non Af Amer: >60 mL/min (ref >60)
Glucose, Bld: 179 mg/dL — ABNORMAL HIGH (ref 70–99)
Potassium: 4.4 mmol/L (ref 3.5–5.1)
Sodium: 135 mmol/L (ref 135–145)
Total Bilirubin: 0.5 mg/dL (ref 0.3–1.2)
Total Protein: 6.4 g/dL — ABNORMAL LOW (ref 6.5–8.1)

## 2019-03-25 LAB — HIV ANTIBODY (ROUTINE TESTING W REFLEX): HIV Screen 4th Generation wRfx: NONREACTIVE — AB

## 2019-03-25 LAB — C-REACTIVE PROTEIN: CRP: 10.7 mg/dL — ABNORMAL HIGH (ref <1.0)

## 2019-03-25 LAB — D-DIMER, QUANTITATIVE: D-Dimer, Quant: 0.91 ug/mL-FEU — ABNORMAL HIGH (ref 0.00–0.50)

## 2019-03-25 LAB — PHOSPHORUS: Phosphorus: 2.1 mg/dL — ABNORMAL LOW (ref 2.5–4.6)

## 2019-03-25 LAB — MAGNESIUM: Magnesium: 2.2 mg/dL (ref 1.7–2.4)

## 2019-03-25 LAB — FERRITIN: Ferritin: 1067 ng/mL — ABNORMAL HIGH (ref 24–336)

## 2019-03-25 LAB — SARS CORONAVIRUS 2 (TAT 6-24 HRS): SARS Coronavirus 2: POSITIVE — AB

## 2019-03-25 LAB — ABO/RH: ABO/RH(D): B POS

## 2019-03-25 MED ORDER — BENZONATATE 100 MG PO CAPS
200.0000 mg | ORAL_CAPSULE | Freq: Three times a day (TID) | ORAL | Status: DC
  Administered 2019-03-25 – 2019-03-29 (×12): 200 mg via ORAL
  Filled 2019-03-25 (×12): qty 2

## 2019-03-25 MED ORDER — ENSURE ENLIVE PO LIQD
237.0000 mL | Freq: Two times a day (BID) | ORAL | Status: DC
  Administered 2019-03-25 – 2019-03-29 (×8): 237 mL via ORAL

## 2019-03-25 MED ORDER — HYDROCOD POLST-CPM POLST ER 10-8 MG/5ML PO SUER
5.0000 mL | Freq: Two times a day (BID) | ORAL | Status: DC
  Administered 2019-03-25 – 2019-03-29 (×8): 5 mL via ORAL
  Filled 2019-03-25 (×8): qty 5

## 2019-03-25 MED ORDER — SODIUM CHLORIDE 0.9 % IV SOLN
100.0000 mg | INTRAVENOUS | Status: AC
  Administered 2019-03-26 – 2019-03-29 (×4): 100 mg via INTRAVENOUS
  Filled 2019-03-25 (×5): qty 20

## 2019-03-25 MED ORDER — SODIUM CHLORIDE 0.9 % IV SOLN
200.0000 mg | Freq: Once | INTRAVENOUS | Status: AC
  Administered 2019-03-25: 200 mg via INTRAVENOUS
  Filled 2019-03-25: qty 40

## 2019-03-25 NOTE — Progress Notes (Addendum)
Pt arrived. Vitals stable. Pt on RA. Pt able to ambulate over to bed. Pt A & O. MD notified of pt arrival on the floor. No complaints of pain.Non productive cough. Admission questions and assessment done. Pt placed on Continuous bedside O2 monitoring. Will continue to monitor.

## 2019-03-25 NOTE — Progress Notes (Signed)
Initial Nutrition Assessment  RD working remotely.  DOCUMENTATION CODES:   Non-severe (moderate) malnutrition in context of acute illness/injury  INTERVENTION:   - Ensure Enlive po BID, each supplement provides 350 kcal and 20 grams of protein  - Magic cup TID with meals, each supplement provides 290 kcal and 9 grams of protein  - Encourage adequate PO intake  NUTRITION DIAGNOSIS:   Moderate Malnutrition related to acute illness (COVID-19 pneumonia) as evidenced by energy intake < 75% for > 7 days, percent weight loss (4% weight loss in less than 1 week).  GOAL:   Patient will meet greater than or equal to 90% of their needs  MONITOR:   PO intake, Supplement acceptance, Labs, Weight trends  REASON FOR ASSESSMENT:   Malnutrition Screening Tool    ASSESSMENT:   58 year old male who presented to the ED on 11/17 with SOB, cough, fatigue, anorexia. Pt is COVID-19 positive. PMH of HTN. Pt admitted with COVID-19 pneumonia.   Spoke with pt via phone call to room. Pt reports that his appetite has been poor for the last week. Pt states he has not been eating much but has been trying to drink fluids, especially water. Pt reports that last night he ate a [place] sandwich and that he had a Coke this morning.  Pt reports he has not yet received his breakfast and also states he has not ordered it. RD took pt's order for breakfast and called [facility]. Pt states he does not want anything "heavy" because he doesn't want to have to keep getting up to go to the bathroom. Pt ordered orange juice and an English muffin with butter and jelly.  Pt endorses a 10 lb weight loss over the last 1.5 weeks. Reviewed weight history in chart. Pt with a 3.4 kg weight loss since 03/22/19. This is a 4% weight loss which is significant for timeframe. Given poor PO intake and weight loss, pt meets criteria for acute malnutrition.  Discussed the importance of adequate PO intake with pt. Pt willing to try Ensure Enlive  to aid in meeting kcal and protein needs.  No meal completions documented at this time.  Medications reviewed and include: Decadron, Protonix, vitamin C 500 mg daily, zinc sulfate 220 mg daily, Remdesivir  Labs reviewed: phosphorus 2.1  NUTRITION - FOCUSED PHYSICAL EXAM:  Unable to complete at this time. RD working remotely.  Diet Order:   Diet Order            Diet regular Room service appropriate? Yes; Fluid consistency: Thin  Diet effective now              EDUCATION NEEDS:   Education needs have been addressed  Skin:  Skin Assessment: Reviewed RN Assessment  Last BM:  03/23/19  Height:   Ht Readings from Last 1 Encounters:  03/25/19 6' (1.829 m)    Weight:   Wt Readings from Last 1 Encounters:  03/25/19 80.5 kg    Ideal Body Weight:  80.9 kg  BMI:  Body mass index is 24.07 kg/m.  Estimated Nutritional Needs:   Kcal:  2200-2400  Protein:  100-120 grams  Fluid:  >/= 2.0 L    Gaynell Face, MS, RD, LDN Inpatient Clinical Dietitian Pager: [phone] Weekend/After Hours: [phone]

## 2019-03-25 NOTE — Progress Notes (Signed)
[facility]                                                                              Patient Demographics  Frederick Williams, is a 58 y.o. male, DOB - 09-May-1960, ZOX:[id number]  Admit date - 03/24/2019   Admitting Physician Frederick Llano, MD  Outpatient Primary MD for the patient is Dettinger, Elige Radon, MD  Outpatient specialists:   LOS - 1  days   Medical records reviewed and are as summarized below:    Chief Complaint  Patient presents with  . Covid  . Shortness of Breath       Brief summary   Patient is a 58 year old male with no significant medical history, presented with shortness of breath, cough and fevers in the setting of known COVID-19 pneumonia.  Patient reported that he was in his usual state of health up until last Monday a week ago.  Then he started having shortness of breath, coughing, fevers, generalized lethargy and fatigue.  Patient was placed on prednisone and Levaquin outpatient however symptoms progressively got worse in the last few days prompting him to seek medical attention.  He reported having anosmia as well.  Denied any diarrhea.  Also reported chest pain with coughing spells.  Patient ported that he could not tolerate oral prednisone because of severe reflux.  No prior history of blood clots.  Quit smoking 6 to 7 years ago. Patient reported that his wife has Covid.  Assessment & Plan    1. Acute Hypoxic Resp. Failure due to Acute Covid 19 Viral Pneumonitis during the ongoing 2020 Covid 19 Pandemic - POA - Patient transiently hypoxic, at the time of my encounter, O2 sats 90 to 91% on room air at rest  -Started on dexamethasone, will place on O2 via nasal cannula. -Start Remdesivir per pharmacy protocol - Continue to wean oxygen, ambulatory O2 screening daily as tolerated - Has known sick contact with his wife who has Covid -Continue supportive treatment with cough suppressants, inhalers, vitamins and supportive care -Follow blood  cultures - Continue to follow labs as below, CRPs starting to trend down, labs improving  Recent Labs  Lab 03/24/19 1424 03/25/19 0428  DDIMER 0.93* 0.91*  FERRITIN 1,735* 1,067*  CRP 11.0* 10.7*  ALT 35 29  PROCALCITON <0.10  --        Malnutrition of moderate degree -Started on nutritional supplements    Code Status: Full CODE STATUS DVT Prophylaxis:  Lovenox Family Communication: Discussed all imaging results, lab results, explained to the patient or *   Disposition Plan: Pending clinical status, currently inpatient given acute hypoxic respiratory failure, acute COVID-19 illness.  Once ambulatory without overt symptoms or hypoxia would consider discharge home.  Pending transfer to [facility]  Time Spent in minutes   35 minutes  Procedures:  None  Consultants:   None  Antimicrobials:   Anti-infectives (From admission, onward)   Start     Dose/Rate Route Frequency Ordered Stop   03/26/19 1000  remdesivir 100 mg in sodium chloride 0.9 % 250 mL IVPB     100 mg 500 mL/hr over 30 Minutes Intravenous Every 24  hours 03/25/19 0949 03/30/19 0959   03/25/19 1030  remdesivir 200 mg in sodium chloride 0.9 % 250 mL IVPB     200 mg 500 mL/hr over 30 Minutes Intravenous Once 03/25/19 0949 03/25/19 1137          Medications  Scheduled Meds: . benzonatate  200 mg Oral TID  . chlorpheniramine-HYDROcodone  5 mL Oral Q12H  . dexamethasone (DECADRON) injection  6 mg Intravenous Q24H  . enoxaparin (LOVENOX) injection  40 mg Subcutaneous Q24H  . feeding supplement (ENSURE ENLIVE)  237 mL Oral BID BM  . pantoprazole (PROTONIX) IV  40 mg Intravenous Q12H  . vitamin C  500 mg Oral Daily  . zinc sulfate  220 mg Oral Daily   Continuous Infusions: . [START ON 03/26/2019] remdesivir 100 mg in NS 250 mL     PRN Meds:.albuterol, guaiFENesin-dextromethorphan, HYDROcodone-acetaminophen, ondansetron **OR** ondansetron (ZOFRAN) IV      Subjective:   [name] was seen and  examined today.  *Intractable coughing during encounter, chest tightness with coughing.  O2 sats 90 to 91% at rest during my exam.  Patient denies dizziness, abdominal pain, N/V/D/C, new weakness, numbess, tingling. No acute events overnight.    Objective:   Vitals:   03/24/19 2230 03/24/19 2300 03/25/19 0010 03/25/19 0430  BP: (!) 100/54 117/73 (!) 153/88 119/75  Pulse: 83 80 88 83  Resp: (!) 21 20 18 16   Temp:   98.7 F (37.1 C) 98.4 F (36.9 C)  TempSrc:   Oral Oral  SpO2: 94% 95% 93% 93%  Weight:   80.5 kg   Height:   6' (1.829 m)    No intake or output data in the 24 hours ending 03/25/19 1259   Wt Readings from Last 3 Encounters:  03/25/19 80.5 kg  03/22/19 83.9 kg  04/10/18 83.5 kg     Exam  General: Alert and oriented x 3, NAD, coughing spells, feeling miserable  Eyes:   HEENT:  Atraumatic, normocephalic, normal oropharynx  Cardiovascular: S1 S2 auscultated, no murmurs, RRR  Respiratory: Clear to auscultation bilaterally, no wheezing, shallow breathing  Gastrointestinal: Soft, nontender, nondistended, + bowel sounds  Ext: no pedal edema bilaterally  Neuro: Moving all 4 extremities  Musculoskeletal: No digital cyanosis, clubbing  Skin: No rashes  Psych: Normal affect and demeanor, alert and oriented x3    Data Reviewed:  I have personally reviewed following labs and imaging studies  Micro Results Recent Results (from the past 240 hour(s))  Blood Culture (routine x 2)     Status: None (Preliminary result)   Collection Time: 03/24/19  2:24 PM   Specimen: BLOOD  Result Value Ref Range Status   Specimen Description   Final    BLOOD LEFT ANTECUBITAL Performed at [facility], [address] [place], [place]    Special Requests   Final    BOTTLES DRAWN AEROBIC AND ANAEROBIC Blood Culture adequate volume Performed at [facility], [address], [place], [place]    Culture   Final    NO  GROWTH < 24 HOURS Performed at [facility], [address], [place], [place]    Report Status PENDING  Incomplete  Blood Culture (routine x 2)     Status: None (Preliminary result)   Collection Time: 03/24/19  2:24 PM   Specimen: BLOOD LEFT HAND  Result Value Ref Range Status   Specimen Description   Final    BLOOD LEFT  HAND Performed at [facility], [address] [place], [place] [address]    Special Requests   Final    BOTTLES DRAWN AEROBIC ONLY Blood Culture results may not be optimal due to an inadequate volume of blood received in culture bottles Performed at [facility], [address] [place], [place] [address]    Culture   Final    NO GROWTH < 24 HOURS Performed at [facility], [address] [place], [place] [address]    Report Status PENDING  Incomplete    Radiology Reports Dg Chest Port 1 View  Result Date: 03/24/2019 CLINICAL DATA:  Chest pain, shortness of breath EXAM: PORTABLE CHEST 1 VIEW COMPARISON:  03/22/2019 FINDINGS: No significant change in heterogeneous bilateral pulmonary opacity. The heart and mediastinum are unremarkable. IMPRESSION: No significant change in heterogeneous bilateral pulmonary opacity, in keeping with reported COVID-19 diagnosis. Electronically Signed   By: Lauralyn PrimesAlex  Bibbey M.D.   On: 03/24/2019 13:55   Dg Chest Portable 1 View  Result Date: 03/22/2019 CLINICAL DATA:  COVID-19 positive, fever, dyspnea EXAM: PORTABLE CHEST 1 VIEW COMPARISON:  04/10/2018 FINDINGS: The heart size and mediastinal contours are within normal limits. Diffusely scattered heterogeneous airspace opacity about the lungs. The visualized skeletal structures are unremarkable. IMPRESSION: Diffusely scattered heterogeneous airspace opacity about the lungs, in keeping with reported COVID-19 infection. Electronically Signed   By: Lauralyn PrimesAlex  Bibbey M.D.   On: 03/22/2019 15:02    Lab Data:  CBC: Recent Labs  Lab  03/24/19 1424 03/24/19 1840 03/25/19 0428  WBC 5.1 5.4 2.4*  NEUTROABS 3.7  --  1.7  HGB 17.4* 16.5 16.0  HCT 51.6 49.0 46.9  MCV 93.6 94.2 92.9  PLT 143* 140* 141*   Basic Metabolic Panel: Recent Labs  Lab 03/24/19 1424 03/24/19 1840 03/25/19 0428  NA 136  --  135  K 4.5  --  4.4  CL 101  --  104  CO2 25  --  21*  GLUCOSE 97  --  179*  BUN 16  --  17  CREATININE 1.38* 1.18 0.99  CALCIUM 8.3*  --  8.1*  MG  --   --  2.2  PHOS  --   --  2.1*   GFR: Estimated Creatinine Clearance: 89.3 mL/min (by C-G formula based on SCr of 0.99 mg/dL). Liver Function Tests: Recent Labs  Lab 03/24/19 1424 03/25/19 0428  AST 40 34  ALT 35 29  ALKPHOS 54 49  BILITOT 0.7 0.5  PROT 7.1 6.4*  ALBUMIN 3.7 3.1*   No results for input(s): LIPASE, AMYLASE in the last 168 hours. No results for input(s): AMMONIA in the last 168 hours. Coagulation Profile: No results for input(s): INR, PROTIME in the last 168 hours. Cardiac Enzymes: No results for input(s): CKTOTAL, CKMB, CKMBINDEX, TROPONINI in the last 168 hours. BNP (last 3 results) No results for input(s): PROBNP in the last 8760 hours. HbA1C: No results for input(s): HGBA1C in the last 72 hours. CBG: No results for input(s): GLUCAP in the last 168 hours. Lipid Profile: Recent Labs    03/24/19 1424  TRIG 126   Thyroid Function Tests: No results for input(s): TSH, T4TOTAL, FREET4, T3FREE, THYROIDAB in the last 72 hours. Anemia Panel: Recent Labs    03/24/19 1424 03/25/19 0428  FERRITIN 1,735* 1,067*   Urine analysis: No results found for: COLORURINE, APPEARANCEUR, LABSPEC, PHURINE, GLUCOSEU, HGBUR, BILIRUBINUR, KETONESUR, PROTEINUR, UROBILINOGEN, NITRITE, LEUKOCYTESUR   Ripudeep Rai M.D. [facility] 03/25/2019,  12:59 PM   Call night coverage person covering after 7pm

## 2019-03-26 DIAGNOSIS — R0682 Tachypnea, not elsewhere classified: Secondary | ICD-10-CM

## 2019-03-26 LAB — COMPREHENSIVE METABOLIC PANEL
ALT: 42 U/L (ref 0–44)
AST: 39 U/L (ref 15–41)
Albumin: 3.2 g/dL — ABNORMAL LOW (ref 3.5–5.0)
Alkaline Phosphatase: 53 U/L (ref 38–126)
Anion gap: 10 (ref 5–15)
BUN: 24 mg/dL — ABNORMAL HIGH (ref 6–20)
CO2: 23 mmol/L (ref 22–32)
Calcium: 8.3 mg/dL — ABNORMAL LOW (ref 8.9–10.3)
Chloride: 105 mmol/L (ref 98–111)
Creatinine, Ser: 0.89 mg/dL (ref 0.61–1.24)
GFR calc Af Amer: >60 mL/min (ref >60)
GFR calc non Af Amer: >60 mL/min (ref >60)
Glucose, Bld: 158 mg/dL — ABNORMAL HIGH (ref 70–99)
Potassium: 4.8 mmol/L (ref 3.5–5.1)
Sodium: 138 mmol/L (ref 135–145)
Total Bilirubin: 0.3 mg/dL (ref 0.3–1.2)
Total Protein: 6.2 g/dL — ABNORMAL LOW (ref 6.5–8.1)

## 2019-03-26 LAB — CBC WITH DIFFERENTIAL/PLATELET
Abs Immature Granulocytes: 0.06 10*3/uL (ref 0.00–0.07)
Basophils Absolute: 0 10*3/uL (ref 0.0–0.1)
Basophils Relative: 0 %
Eosinophils Absolute: 0 10*3/uL (ref 0.0–0.5)
Eosinophils Relative: 0 %
HCT: 45.8 % (ref 39.0–52.0)
Hemoglobin: 15.5 g/dL (ref 13.0–17.0)
Immature Granulocytes: 1 %
Lymphocytes Relative: 12 %
Lymphs Abs: 0.9 10*3/uL (ref 0.7–4.0)
MCH: 31.6 pg (ref 26.0–34.0)
MCHC: 33.8 g/dL (ref 30.0–36.0)
MCV: 93.5 fL (ref 80.0–100.0)
Monocytes Absolute: 0.6 10*3/uL (ref 0.1–1.0)
Monocytes Relative: 8 %
Neutro Abs: 6.2 10*3/uL (ref 1.7–7.7)
Neutrophils Relative %: 79 %
Platelets: 172 10*3/uL (ref 150–400)
RBC: 4.9 MIL/uL (ref 4.22–5.81)
RDW: 11.7 % (ref 11.5–15.5)
WBC: 7.7 10*3/uL (ref 4.0–10.5)
nRBC: 0 % (ref 0.0–0.2)

## 2019-03-26 LAB — MAGNESIUM: Magnesium: 2.3 mg/dL (ref 1.7–2.4)

## 2019-03-26 LAB — C-REACTIVE PROTEIN: CRP: 5 mg/dL — ABNORMAL HIGH (ref <1.0)

## 2019-03-26 LAB — D-DIMER, QUANTITATIVE: D-Dimer, Quant: 0.66 ug/mL-FEU — ABNORMAL HIGH (ref 0.00–0.50)

## 2019-03-26 LAB — FERRITIN: Ferritin: 977 ng/mL — ABNORMAL HIGH (ref 24–336)

## 2019-03-26 LAB — PHOSPHORUS: Phosphorus: 2.7 mg/dL (ref 2.5–4.6)

## 2019-03-26 MED ORDER — PANTOPRAZOLE SODIUM 40 MG PO TBEC
40.0000 mg | DELAYED_RELEASE_TABLET | Freq: Two times a day (BID) | ORAL | Status: DC
  Administered 2019-03-26 – 2019-03-29 (×7): 40 mg via ORAL
  Filled 2019-03-26 (×6): qty 1

## 2019-03-26 NOTE — Progress Notes (Addendum)
[facility]                                                                              Patient Demographics  Frederick Williams, is a 58 y.o. male, DOB - 1961/02/23, ZOX:[id number]  Admit date - 03/24/2019   Admitting Physician Clydia LlanoAravind Chandra, MD  Outpatient Primary MD for the patient is Dettinger, Elige RadonJoshua A, MD  Outpatient specialists:   LOS - 2  days   Medical records reviewed and are as summarized below:    Chief Complaint  Patient presents with  . Covid  . Shortness of Breath       Brief summary   Patient is a 58 year old male with no significant medical history, presented with shortness of breath, cough and fevers in the setting of known COVID-19 pneumonia.  Patient reported that he was in his usual state of health up until last Monday a week ago.  Then he started having shortness of breath, coughing, fevers, generalized lethargy and fatigue.  Patient was placed on prednisone and Levaquin outpatient however symptoms progressively got worse in the last few days prompting him to seek medical attention.  He reported having anosmia as well.  Denied any diarrhea.  Also reported chest pain with coughing spells.  Patient ported that he could not tolerate oral prednisone because of severe reflux.  No prior history of blood clots.  Quit smoking 6 to 7 years ago. Patient reported that his wife has Covid.  Assessment & Plan    1. Acute Hypoxic Resp. Failure due to Acute Covid 19 Viral Pneumonitis during the ongoing 2020 Covid 19 Pandemic - POA -Still hypoxic, O2 sats  96% on 3 L.  Slightly feels better from yesterday. -Started on IV dexamethasone, remdesivir per protocol  - Continue to wean oxygen, ambulatory O2 screening daily as tolerated - Has known sick contact with his wife who has Covid -Continue supportive treatment with cough suppressants, inhalers, vitamins and supportive care -Follow blood cultures - Continue to follow labs as below, inflammatory markers  trending down.  Recent Labs  Lab 03/24/19 1424 03/25/19 0428 03/26/19 0504  DDIMER 0.93* 0.91* 0.66*  FERRITIN 1,735* 1,067* 977*  CRP 11.0* 10.7* 5.0*  ALT 35 29 42  PROCALCITON <0.10  --   --        Malnutrition of moderate degree -Continue nutritional supplements   Acute kidney injury  -Likely due to #1, creatinine 1.38 at the time of admission, was placed on gentle IV fluid hydration -Creatinine improved to 0.8 today  Code Status: Full CODE STATUS DVT Prophylaxis:  Lovenox Family Communication: Discussed all imaging results, lab results, explained to the patient    Disposition Plan: Pending clinical status, currently inpatient given acute hypoxic respiratory failure, acute COVID-19 illness.  Once ambulatory without overt symptoms or hypoxia would consider discharge home.  Pending transfer to [facility]  Time Spent in minutes 25 minutes  Procedures:  None  Consultants:   None  Antimicrobials:   Anti-infectives (From admission, onward)   Start     Dose/Rate Route Frequency Ordered Stop   03/26/19 1000  remdesivir 100 mg in sodium chloride 0.9 % 250  mL IVPB     100 mg 500 mL/hr over 30 Minutes Intravenous Every 24 hours 03/25/19 0949 03/30/19 0959   03/25/19 1030  remdesivir 200 mg in sodium chloride 0.9 % 250 mL IVPB     200 mg 500 mL/hr over 30 Minutes Intravenous Once 03/25/19 0949 03/25/19 1137         Medications  Scheduled Meds: . benzonatate  200 mg Oral TID  . chlorpheniramine-HYDROcodone  5 mL Oral Q12H  . dexamethasone (DECADRON) injection  6 mg Intravenous Q24H  . enoxaparin (LOVENOX) injection  40 mg Subcutaneous Q24H  . feeding supplement (ENSURE ENLIVE)  237 mL Oral BID BM  . pantoprazole  40 mg Oral BID  . vitamin C  500 mg Oral Daily  . zinc sulfate  220 mg Oral Daily   Continuous Infusions: . remdesivir 100 mg in NS 250 mL 100 mg (03/26/19 1031)   PRN Meds:.albuterol, guaiFENesin-dextromethorphan, HYDROcodone-acetaminophen, ondansetron  **OR** ondansetron (ZOFRAN) IV      Subjective:   Dardan Shelton was seen and examined today.  Cough much improving from yesterday. Overall feels better from yesterday still sats 96% on 3 L.  No nausea vomiting or abdominal pain.  No acute events overnight.   Objective:   Vitals:   03/25/19 2130 03/26/19 0506 03/26/19 1039 03/26/19 1300  BP:  121/89 113/80 114/82  Pulse:  64 68 68  Resp:   18 20  Temp:  97.7 F (36.5 C) 97.6 F (36.4 C) 98 F (36.7 C)  TempSrc:  Oral Oral Oral  SpO2: 95% 94% 95% 96%  Weight:      Height:        Intake/Output Summary (Last 24 hours) at 03/26/2019 1511 Last data filed at 03/25/2019 2200 Gross per 24 hour  Intake 240 ml  Output -  Net 240 ml     Wt Readings from Last 3 Encounters:  03/25/19 80.5 kg  03/22/19 83.9 kg  04/10/18 83.5 kg    Physical Exam  General: Alert and oriented x 3, NAD  Eyes:   HEENT:  Atraumatic  Cardiovascular: S1 S2 clear, no murmurs, RRR. No pedal edema b/l  Respiratory: CTAB, no wheezing, rales or rhonchi  Gastrointestinal: Soft, nontender, nondistended, NBS  Ext: no pedal edema bilaterally  Neuro: no new deficits  Musculoskeletal: No cyanosis, clubbing  Skin: No rashes  Psych: Normal affect and demeanor, alert and oriented x3    Data Reviewed:  I have personally reviewed following labs and imaging studies  Micro Results Recent Results (from the past 240 hour(s))  Blood Culture (routine x 2)     Status: None (Preliminary result)   Collection Time: 03/24/19  2:24 PM   Specimen: BLOOD  Result Value Ref Range Status   Specimen Description   Final    BLOOD LEFT ANTECUBITAL Performed at [facility] [address], [place], [place] [address]    Special Requests   Final    BOTTLES DRAWN AEROBIC AND ANAEROBIC Blood Culture adequate volume Performed at [facility] [address], [place], [place] [address]    Culture   Final    NO GROWTH 2 DAYS  Performed at [facility] [address] [place],  [address]    Report Status PENDING  Incomplete  Blood Culture (routine x 2)     Status: None (Preliminary result)   Collection Time: 03/24/19  2:24 PM   Specimen: BLOOD LEFT HAND  Result Value Ref Range Status  Specimen Description   Final    BLOOD LEFT HAND Performed at [facility], [address], [place], [place] [address]    Special Requests   Final    BOTTLES DRAWN AEROBIC ONLY Blood Culture results may not be optimal due to an inadequate volume of blood received in culture bottles Performed at [facility], [address], [place], [place] [address]    Culture   Final    NO GROWTH 2 DAYS Performed at [facility], [address], [place], [place] [address]    Report Status PENDING  Incomplete  SARS CORONAVIRUS 2 (TAT 6-24 HRS) Nasopharyngeal Nasopharyngeal Swab     Status: Abnormal   Collection Time: 03/25/19 11:11 AM   Specimen: Nasopharyngeal Swab  Result Value Ref Range Status   SARS Coronavirus 2 POSITIVE (A) NEGATIVE Final    Comment: RESULT CALLED TO, READ BACK BY AND VERIFIED WITH: S.JOHNSON,RN [id number] I.MANNING (NOTE) SARS-CoV-2 target nucleic acids are DETECTED. The SARS-CoV-2 RNA is generally detectable in upper and lower respiratory specimens during the acute phase of infection. Positive results are indicative of active infection with SARS-CoV-2. Clinical  correlation with patient history and other diagnostic information is necessary to determine patient infection status. Positive results do  not rule out bacterial infection or co-infection with other viruses. The expected result is Negative. Fact Sheet for Patients: HairSlick.no Fact Sheet for Healthcare Providers: quierodirigir.com This test is not yet approved or cleared by the [place] FDA and  has been authorized for detection  and/or diagnosis of SARS-CoV-2 by FDA under an Emergency Use Authorization (EUA). This EUA will remain  in effect (meaning this test can be used) for  the duration of the COVID-19 declaration under Section 564(b)(1) of the Act, 21 U.S.C. section 360bbb-3(b)(1), unless the authorization is terminated or revoked sooner. Performed at [facility], [address], [place], [place] [address]     Radiology Reports Dg Chest [place] 1 View  Result Date: 03/24/2019 CLINICAL DATA:  Chest pain, shortness of breath EXAM: PORTABLE CHEST 1 VIEW COMPARISON:  03/22/2019 FINDINGS: No significant change in heterogeneous bilateral pulmonary opacity. The heart and mediastinum are unremarkable. IMPRESSION: No significant change in heterogeneous bilateral pulmonary opacity, in keeping with reported COVID-19 diagnosis. Electronically Signed   By: Lauralyn Primes M.D.   On: 03/24/2019 13:55   Dg Chest Portable 1 View  Result Date: 03/22/2019 CLINICAL DATA:  COVID-19 positive, fever, dyspnea EXAM: PORTABLE CHEST 1 VIEW COMPARISON:  04/10/2018 FINDINGS: The heart size and mediastinal contours are within normal limits. Diffusely scattered heterogeneous airspace opacity about the lungs. The visualized skeletal structures are unremarkable. IMPRESSION: Diffusely scattered heterogeneous airspace opacity about the lungs, in keeping with reported COVID-19 infection. Electronically Signed   By: Lauralyn Primes M.D.   On: 03/22/2019 15:02    Lab Data:  CBC: Recent Labs  Lab 03/24/19 1424 03/24/19 1840 03/25/19 0428 03/26/19 0504  WBC 5.1 5.4 2.4* 7.7  NEUTROABS 3.7  --  1.7 6.2  HGB 17.4* 16.5 16.0 15.5  HCT 51.6 49.0 46.9 45.8  MCV 93.6 94.2 92.9 93.5  PLT 143* 140* 141* 172   Basic Metabolic Panel: Recent Labs  Lab 03/24/19 1424 03/24/19 1840 03/25/19 0428 03/26/19 0504  NA 136  --  135 138  K 4.5  --  4.4 4.8  CL 101  --  104 105  CO2 25  --  21* 23  GLUCOSE 97  --  179* 158*  BUN 16  --  17 24*   CREATININE 1.38* 1.18 0.99 0.89  CALCIUM 8.3*  --  8.1* 8.3*  MG  --   --  2.2 2.3  PHOS  --   --  2.1* 2.7   GFR: Estimated Creatinine Clearance: 99.3 mL/min (by C-G formula based on SCr of 0.89 mg/dL). Liver Function Tests: Recent Labs  Lab 03/24/19 1424 03/25/19 0428 03/26/19 0504  AST 40 34 39  ALT 35 29 42  ALKPHOS 54 49 53  BILITOT 0.7 0.5 0.3  PROT 7.1 6.4* 6.2*  ALBUMIN 3.7 3.1* 3.2*   No results for input(s): LIPASE, AMYLASE in the last 168 hours. No results for input(s): AMMONIA in the last 168 hours. Coagulation Profile: No results for input(s): INR, PROTIME in the last 168 hours. Cardiac Enzymes: No results for input(s): CKTOTAL, CKMB, CKMBINDEX, TROPONINI in the last 168 hours. BNP (last 3 results) No results for input(s): PROBNP in the last 8760 hours. HbA1C: No results for input(s): HGBA1C in the last 72 hours. CBG: No results for input(s): GLUCAP in the last 168 hours. Lipid Profile: Recent Labs    03/24/19 1424  TRIG 126   Thyroid Function Tests: No results for input(s): TSH, T4TOTAL, FREET4, T3FREE, THYROIDAB in the last 72 hours. Anemia Panel: Recent Labs    03/25/19 0428 03/26/19 0504  FERRITIN 1,067* 977*   Urine analysis: No results found for: COLORURINE, APPEARANCEUR, LABSPEC, PHURINE, GLUCOSEU, HGBUR, BILIRUBINUR, KETONESUR, PROTEINUR, UROBILINOGEN, NITRITE, LEUKOCYTESUR   Wray Goehring M.D. [facility] 03/26/2019, 3:11 PM   Call night coverage person covering after 7pm

## 2019-03-26 NOTE — Progress Notes (Signed)

## 2019-03-27 LAB — COMPREHENSIVE METABOLIC PANEL
ALT: 42 U/L (ref 0–44)
AST: 28 U/L (ref 15–41)
Albumin: 2.8 g/dL — ABNORMAL LOW (ref 3.5–5.0)
Alkaline Phosphatase: 48 U/L (ref 38–126)
Anion gap: 10 (ref 5–15)
BUN: 24 mg/dL — ABNORMAL HIGH (ref 6–20)
CO2: 23 mmol/L (ref 22–32)
Calcium: 8.2 mg/dL — ABNORMAL LOW (ref 8.9–10.3)
Chloride: 106 mmol/L (ref 98–111)
Creatinine, Ser: 0.88 mg/dL (ref 0.61–1.24)
GFR calc Af Amer: >60 mL/min (ref >60)
GFR calc non Af Amer: >60 mL/min (ref >60)
Glucose, Bld: 150 mg/dL — ABNORMAL HIGH (ref 70–99)
Potassium: 4.5 mmol/L (ref 3.5–5.1)
Sodium: 139 mmol/L (ref 135–145)
Total Bilirubin: 0.4 mg/dL (ref 0.3–1.2)
Total Protein: 5.8 g/dL — ABNORMAL LOW (ref 6.5–8.1)

## 2019-03-27 LAB — CBC WITH DIFFERENTIAL/PLATELET
Abs Immature Granulocytes: 0.06 10*3/uL (ref 0.00–0.07)
Basophils Absolute: 0 10*3/uL (ref 0.0–0.1)
Basophils Relative: 0 %
Eosinophils Absolute: 0 10*3/uL (ref 0.0–0.5)
Eosinophils Relative: 0 %
HCT: 44.8 % (ref 39.0–52.0)
Hemoglobin: 15 g/dL (ref 13.0–17.0)
Immature Granulocytes: 1 %
Lymphocytes Relative: 9 %
Lymphs Abs: 0.9 10*3/uL (ref 0.7–4.0)
MCH: 31.6 pg (ref 26.0–34.0)
MCHC: 33.5 g/dL (ref 30.0–36.0)
MCV: 94.3 fL (ref 80.0–100.0)
Monocytes Absolute: 0.5 10*3/uL (ref 0.1–1.0)
Monocytes Relative: 5 %
Neutro Abs: 9 10*3/uL — ABNORMAL HIGH (ref 1.7–7.7)
Neutrophils Relative %: 85 %
Platelets: 199 10*3/uL (ref 150–400)
RBC: 4.75 MIL/uL (ref 4.22–5.81)
RDW: 11.7 % (ref 11.5–15.5)
WBC: 10.6 10*3/uL — ABNORMAL HIGH (ref 4.0–10.5)
nRBC: 0 % (ref 0.0–0.2)

## 2019-03-27 LAB — MAGNESIUM: Magnesium: 2 mg/dL (ref 1.7–2.4)

## 2019-03-27 LAB — D-DIMER, QUANTITATIVE: D-Dimer, Quant: 0.44 ug/mL-FEU (ref 0.00–0.50)

## 2019-03-27 LAB — C-REACTIVE PROTEIN: CRP: 1.9 mg/dL — ABNORMAL HIGH (ref <1.0)

## 2019-03-27 LAB — PHOSPHORUS: Phosphorus: 2.8 mg/dL (ref 2.5–4.6)

## 2019-03-27 LAB — FERRITIN: Ferritin: 855 ng/mL — ABNORMAL HIGH (ref 24–336)

## 2019-03-27 MED ORDER — IPRATROPIUM-ALBUTEROL 20-100 MCG/ACT IN AERS
2.0000 | INHALATION_SPRAY | Freq: Four times a day (QID) | RESPIRATORY_TRACT | Status: DC
  Administered 2019-03-27 – 2019-03-28 (×8): 2 via RESPIRATORY_TRACT
  Filled 2019-03-27: qty 4

## 2019-03-27 MED ORDER — ALBUTEROL SULFATE HFA 108 (90 BASE) MCG/ACT IN AERS
1.0000 | INHALATION_SPRAY | RESPIRATORY_TRACT | Status: DC | PRN
  Administered 2019-03-27: 2 via RESPIRATORY_TRACT
  Filled 2019-03-27: qty 6.7

## 2019-03-27 MED ORDER — ALBUTEROL SULFATE HFA 108 (90 BASE) MCG/ACT IN AERS
1.0000 | INHALATION_SPRAY | RESPIRATORY_TRACT | Status: DC
  Filled 2019-03-27: qty 6.7

## 2019-03-27 NOTE — Progress Notes (Signed)
[facility]                                                                              Patient Demographics  Frederick Williams, is a 58 y.o. male, DOB - 04-25-1961, CBJ:[id number]  Admit date - 03/24/2019   Admitting Physician Truddie Hidden, MD  Outpatient Primary MD for the patient is Dettinger, Fransisca Kaufmann, MD  Outpatient specialists:   LOS - 3  days   Medical records reviewed and are as summarized below:    Chief Complaint  Patient presents with   Covid   Shortness of Breath       Brief summary   Patient is a 58 year old male with no significant medical history, presented with shortness of breath, cough and fevers in the setting of known COVID-19 pneumonia.  Patient reported that he was in his usual state of health up until last Monday a week ago.  Then he started having shortness of breath, coughing, fevers, generalized lethargy and fatigue.  Patient was placed on prednisone and Levaquin outpatient however symptoms progressively got worse in the last few days prompting him to seek medical attention.  He reported having anosmia as well.  Denied any diarrhea.  Also reported chest pain with coughing spells.  Patient ported that he could not tolerate oral prednisone because of severe reflux.  No prior history of blood clots.  Quit smoking 6 to 7 years ago. Patient reported that his wife has Covid.  Assessment & Plan    1. Acute Hypoxic Resp. Failure due to Acute Covid 19 Viral Pneumonitis during the ongoing 2020 Covid 19 Pandemic - POA -Still somewhat hypoxic, feels winded on ambulation.  O2 sats 96% on 3 L.  Coughing is improving - Started on IV dexamethasone, remdesivir per protocol.  Overall improving, CRP improving, hold off on Actemra. - Continue to wean oxygen, ambulatory O2 screening daily as tolerated - Continue supportive treatment with cough suppressants, inhalers, vitamins and supportive care,  -Added scheduled Combivent, albuterol inhaler as needed,  flutter valve -Blood cultures negative so far - Continue to follow labs as below, inflammatory markers trending down.  Recent Labs  Lab 03/24/19 1424 03/25/19 0428 03/26/19 0504 03/27/19 0426  DDIMER 0.93* 0.91* 0.66* 0.44  FERRITIN 1,735* 1,067* 977* 855*  CRP 11.0* 10.7* 5.0* 1.9*  ALT 35 29 62 42  PROCALCITON <0.10  --   --   --      Malnutrition of moderate degree -cont nutritional supplements   Acute kidney injury  - Likely due to #1, creatinine 1.38 at the time of admission, was placed on gentle IV fluid hydration - Resolved, creatinine 0.8  Code Status: Full CODE STATUS DVT Prophylaxis:  Lovenox Family Communication: Discussed all imaging results, lab results, explained to the patient's wife on the phone   Disposition Plan: Pending clinical status, currently inpatient given acute hypoxic respiratory failure, acute COVID-19 illness. Once ambulatory without overt symptoms or hypoxia would consider discharge home.  Will complete remdesivir on Sunday  Time Spent in minutes 25 minutes  Procedures:  None  Consultants:   None  Antimicrobials:   Anti-infectives (From admission, onward)   Start  Dose/Rate Route Frequency Ordered Stop   03/26/19 1000  remdesivir 100 mg in sodium chloride 0.9 % 250 mL IVPB     100 mg 500 mL/hr over 30 Minutes Intravenous Every 24 hours 03/25/19 0949 03/30/19 0959   03/25/19 1030  remdesivir 200 mg in sodium chloride 0.9 % 250 mL IVPB     200 mg 500 mL/hr over 30 Minutes Intravenous Once 03/25/19 0949 03/25/19 1137         Medications  Scheduled Meds:  benzonatate  200 mg Oral TID   chlorpheniramine-HYDROcodone  5 mL Oral Q12H   dexamethasone (DECADRON) injection  6 mg Intravenous Q24H   enoxaparin (LOVENOX) injection  40 mg Subcutaneous Q24H   feeding supplement (ENSURE ENLIVE)  237 mL Oral BID BM   Ipratropium-Albuterol  2 puff Inhalation QID   pantoprazole  40 mg Oral BID   vitamin C  500 mg Oral Daily    zinc sulfate  220 mg Oral Daily   Continuous Infusions:  remdesivir 100 mg in NS 250 mL 100 mg (03/27/19 1015)   PRN Meds:.albuterol, guaiFENesin-dextromethorphan, HYDROcodone-acetaminophen, ondansetron **OR** ondansetron (ZOFRAN) IV      Subjective:   Frederick Williams was seen and examined today.  States he was having some wheezing overnight.  No fevers or chills.  Still feels winded on ambulation.  Cough is getting better.  Sats still around 95% on 3 L.    No nausea vomiting or abdominal pain.  No acute events overnight.   Objective:   Vitals:   03/26/19 2219 03/26/19 2222 03/27/19 0447 03/27/19 1142  BP: 117/83 114/74 (!) 125/93 (!) 103/91  Pulse: 65 63 74 72  Resp: 20 20 18 15   Temp: 97.6 F (36.4 C) 98.5 F (36.9 C) 98.5 F (36.9 C) 97.7 F (36.5 C)  TempSrc: Oral Oral  Oral  SpO2: 98% 97% 96% 100%  Weight:      Height:        Intake/Output Summary (Last 24 hours) at 03/27/2019 1356 Last data filed at 03/27/2019 0600 Gross per 24 hour  Intake 730 ml  Output --  Net 730 ml     Wt Readings from Last 3 Encounters:  03/25/19 80.5 kg  03/22/19 83.9 kg  04/10/18 83.5 kg   Physical Exam  General: Alert and oriented x 3, NAD  Eyes:   HEENT:  Atraumatic, normocephalic  Cardiovascular: S1 S2 clear, no murmurs, RRR. No pedal edema b/l  Respiratory: Mild scattered wheezing  Gastrointestinal: Soft, nontender, nondistended, NBS  Ext: no pedal edema bilaterally  Neuro: no new deficits  Musculoskeletal: No cyanosis, clubbing  Skin: No rashes  Psych: Normal affect and demeanor, alert and oriented x3   Data Reviewed:  I have personally reviewed following labs and imaging studies  Micro Results Recent Results (from the past 240 hour(s))  Blood Culture (routine x 2)     Status: None (Preliminary result)   Collection Time: 03/24/19  2:24 PM   Specimen: BLOOD  Result Value Ref Range Status   Specimen Description   Final    BLOOD LEFT ANTECUBITAL Performed at  [facility], [address] [place], [place] [address]    Special Requests   Final    BOTTLES DRAWN AEROBIC AND ANAEROBIC Blood Culture adequate volume Performed at [facility], [address] [place], [place] [id number]    Culture   Final    NO GROWTH 3 DAYS Performed at [facility], [address]  [place], [place] [address]    Report Status PENDING  Incomplete  Blood Culture (routine x 2)     Status: None (Preliminary result)   Collection Time: 03/24/19  2:24 PM   Specimen: BLOOD LEFT HAND  Result Value Ref Range Status   Specimen Description   Final    BLOOD LEFT HAND Performed at [facility], [address] [place], [place] [address]    Special Requests   Final    BOTTLES DRAWN AEROBIC ONLY Blood Culture results may not be optimal due to an inadequate volume of blood received in culture bottles Performed at [facility], [address], [place], [place] [address]    Culture   Final    NO GROWTH 3 DAYS Performed at [facility], [address], [place], [place] [address]    Report Status PENDING  Incomplete  SARS CORONAVIRUS 2 (TAT 6-24 HRS) Nasopharyngeal Nasopharyngeal Swab     Status: Abnormal   Collection Time: 03/25/19 11:11 AM   Specimen: Nasopharyngeal Swab  Result Value Ref Range Status   SARS Coronavirus 2 POSITIVE (A) NEGATIVE Final    Comment: RESULT CALLED TO, READ BACK BY AND VERIFIED WITH: S.JOHNSON,RN [id number] I.MANNING (NOTE) SARS-CoV-2 target nucleic acids are DETECTED. The SARS-CoV-2 RNA is generally detectable in upper and lower respiratory specimens during the acute phase of infection. Positive results are indicative of active infection with SARS-CoV-2. Clinical  correlation with patient history and other diagnostic information is necessary to determine patient infection status. Positive results do  not rule out bacterial infection or co-infection with  other viruses. The expected result is Negative. Fact Sheet for Patients: HairSlick.no Fact Sheet for Healthcare Providers: quierodirigir.com This test is not yet approved or cleared by the [place] FDA and  has been authorized for detection and/or diagnosis of SARS-CoV-2 by FDA under an Emergency Use Authorization (EUA). This EUA will remain  in effect (meaning this test can be used) for  the duration of the COVID-19 declaration under Section 564(b)(1) of the Act, 21 U.S.C. section 360bbb-3(b)(1), unless the authorization is terminated or revoked sooner. Performed at [facility], [address], [place], [place] [address]     Radiology Reports Dg Chest [place] 1 View  Result Date: 03/24/2019 CLINICAL DATA:  Chest pain, shortness of breath EXAM: PORTABLE CHEST 1 VIEW COMPARISON:  03/22/2019 FINDINGS: No significant change in heterogeneous bilateral pulmonary opacity. The heart and mediastinum are unremarkable. IMPRESSION: No significant change in heterogeneous bilateral pulmonary opacity, in keeping with reported COVID-19 diagnosis. Electronically Signed   By: Lauralyn Primes M.D.   On: 03/24/2019 13:55   Dg Chest Portable 1 View  Result Date: 03/22/2019 CLINICAL DATA:  COVID-19 positive, fever, dyspnea EXAM: PORTABLE CHEST 1 VIEW COMPARISON:  04/10/2018 FINDINGS: The heart size and mediastinal contours are within normal limits. Diffusely scattered heterogeneous airspace opacity about the lungs. The visualized skeletal structures are unremarkable. IMPRESSION: Diffusely scattered heterogeneous airspace opacity about the lungs, in keeping with reported COVID-19 infection. Electronically Signed   By: Lauralyn Primes M.D.   On: 03/22/2019 15:02    Lab Data:  CBC: Recent Labs  Lab 03/24/19 1424 03/24/19 1840 03/25/19 0428 03/26/19 0504 03/27/19 0426  WBC 5.1 5.4 2.4* 7.7 10.6*  NEUTROABS 3.7  --  1.7 6.2 9.0*  HGB 17.4* 16.5  16.0 15.5 15.0  HCT 51.6 49.0 46.9 45.8 44.8  MCV 93.6 94.2 92.9 93.5 94.3  PLT 143* 140* 141* 172 199  Basic Metabolic Panel: Recent Labs  Lab 03/24/19 1424 03/24/19 1840 03/25/19 0428 03/26/19 0504 03/27/19 0426  NA 136  --  135 138 139  K 4.5  --  4.4 4.8 4.5  CL 101  --  104 105 106  CO2 25  --  21* 23 23  GLUCOSE 97  --  179* 158* 150*  BUN 16  --  17 24* 24*  CREATININE 1.38* 1.18 0.99 0.89 0.88  CALCIUM 8.3*  --  8.1* 8.3* 8.2*  MG  --   --  2.2 2.3 2.0  PHOS  --   --  2.1* 2.7 2.8   GFR: Estimated Creatinine Clearance: 100.4 mL/min (by C-G formula based on SCr of 0.88 mg/dL). Liver Function Tests: Recent Labs  Lab 03/24/19 1424 03/25/19 0428 03/26/19 0504 03/27/19 0426  AST 40 34 39 28  ALT 35 29 42 42  ALKPHOS 54 49 53 48  BILITOT 0.7 0.5 0.3 0.4  PROT 7.1 6.4* 6.2* 5.8*  ALBUMIN 3.7 3.1* 3.2* 2.8*   No results for input(s): LIPASE, AMYLASE in the last 168 hours. No results for input(s): AMMONIA in the last 168 hours. Coagulation Profile: No results for input(s): INR, PROTIME in the last 168 hours. Cardiac Enzymes: No results for input(s): CKTOTAL, CKMB, CKMBINDEX, TROPONINI in the last 168 hours. BNP (last 3 results) No results for input(s): PROBNP in the last 8760 hours. HbA1C: No results for input(s): HGBA1C in the last 72 hours. CBG: No results for input(s): GLUCAP in the last 168 hours. Lipid Profile: Recent Labs    03/24/19 1424  TRIG 126   Thyroid Function Tests: No results for input(s): TSH, T4TOTAL, FREET4, T3FREE, THYROIDAB in the last 72 hours. Anemia Panel: Recent Labs    03/26/19 0504 03/27/19 0426  FERRITIN 977* 855*   Urine analysis: No results found for: COLORURINE, APPEARANCEUR, LABSPEC, PHURINE, GLUCOSEU, HGBUR, BILIRUBINUR, KETONESUR, PROTEINUR, UROBILINOGEN, NITRITE, LEUKOCYTESUR   Frederick Williams M.D. [facility] 03/27/2019, 1:56 PM   Call night coverage person covering after 7pm

## 2019-03-27 NOTE — TOC Initial Note (Signed)
Transition of Care [facility]) - Initial/Assessment Note    Patient Details  Name: Frederick Williams MRN: [id number] Date of Birth: 1960/08/10  Transition of Care [facility]) CM/SW Contact:    Purcell Mouton, RN Phone Number: 03/27/2019, 11:26 AM  Clinical Narrative:                 Pt may need Home O2. Will follow for O2 Desats and Home O2 orders.   Expected Discharge Plan: Home/Self Care Barriers to Discharge: No Barriers Identified   Patient Goals and CMS Choice        Expected Discharge Plan and Services Expected Discharge Plan: Home/Self Care       Living arrangements for the past 2 months: Single Family Home                                      Prior Living Arrangements/Services Living arrangements for the past 2 months: Single Family Home Lives with:: Spouse Patient language and need for interpreter reviewed:: No Do you feel safe going back to the place where you live?: Yes               Activities of Daily Living Home Assistive Devices/Equipment: None ADL Screening (condition at time of admission) Patient's cognitive ability adequate to safely complete daily activities?: Yes Is the patient deaf or have difficulty hearing?: No Does the patient have difficulty seeing, even when wearing glasses/contacts?: No Does the patient have difficulty concentrating, remembering, or making decisions?: Yes Patient able to express need for assistance with ADLs?: Yes Does the patient have difficulty dressing or bathing?: No Independently performs ADLs?: Yes (appropriate for developmental age) Does the patient have difficulty walking or climbing stairs?: No Weakness of Legs: None Weakness of Arms/Hands: None  Permission Sought/Granted Permission sought to share information with : Case Manager                Emotional Assessment       Orientation: : Oriented to Self, Oriented to Place, Oriented to  Time, Oriented to Situation      Admission diagnosis:   Tachypnea [R06.82] COVID-19 [U07.1] Pneumonia due to COVID-19 virus [U07.1, J12.89] Patient Active Problem List   Diagnosis Date Noted  . Malnutrition of moderate degree 03/25/2019  . Pneumonia due to COVID-19 virus 03/24/2019   PCP:  Dettinger, Fransisca Kaufmann, MD Pharmacy:   [facility], [address] [place] [address] Phone: [phone] Fax: [phone]  [facility] [address] [place] [address] Phone: [phone] Fax: [phone]     Social Determinants of Health (SDOH) Interventions    Readmission Risk Interventions No flowsheet data found.

## 2019-03-28 LAB — CBC WITH DIFFERENTIAL/PLATELET
Abs Immature Granulocytes: 0.06 10*3/uL (ref 0.00–0.07)
Basophils Absolute: 0 10*3/uL (ref 0.0–0.1)
Basophils Relative: 0 %
Eosinophils Absolute: 0 10*3/uL (ref 0.0–0.5)
Eosinophils Relative: 0 %
HCT: 45.2 % (ref 39.0–52.0)
Hemoglobin: 14.8 g/dL (ref 13.0–17.0)
Immature Granulocytes: 1 %
Lymphocytes Relative: 8 %
Lymphs Abs: 0.7 10*3/uL (ref 0.7–4.0)
MCH: 31.2 pg (ref 26.0–34.0)
MCHC: 32.7 g/dL (ref 30.0–36.0)
MCV: 95.4 fL (ref 80.0–100.0)
Monocytes Absolute: 0.3 10*3/uL (ref 0.1–1.0)
Monocytes Relative: 4 %
Neutro Abs: 8.2 10*3/uL — ABNORMAL HIGH (ref 1.7–7.7)
Neutrophils Relative %: 87 %
Platelets: 232 10*3/uL (ref 150–400)
RBC: 4.74 MIL/uL (ref 4.22–5.81)
RDW: 11.6 % (ref 11.5–15.5)
WBC: 9.4 10*3/uL (ref 4.0–10.5)
nRBC: 0 % (ref 0.0–0.2)

## 2019-03-28 LAB — COMPREHENSIVE METABOLIC PANEL
ALT: 140 U/L — ABNORMAL HIGH (ref 0–44)
AST: 83 U/L — ABNORMAL HIGH (ref 15–41)
Albumin: 3.2 g/dL — ABNORMAL LOW (ref 3.5–5.0)
Alkaline Phosphatase: 55 U/L (ref 38–126)
Anion gap: 9 (ref 5–15)
BUN: 23 mg/dL — ABNORMAL HIGH (ref 6–20)
CO2: 28 mmol/L (ref 22–32)
Calcium: 8.5 mg/dL — ABNORMAL LOW (ref 8.9–10.3)
Chloride: 103 mmol/L (ref 98–111)
Creatinine, Ser: 0.93 mg/dL (ref 0.61–1.24)
GFR calc Af Amer: >60 mL/min (ref >60)
GFR calc non Af Amer: >60 mL/min (ref >60)
Glucose, Bld: 164 mg/dL — ABNORMAL HIGH (ref 70–99)
Potassium: 5 mmol/L (ref 3.5–5.1)
Sodium: 140 mmol/L (ref 135–145)
Total Bilirubin: 0.5 mg/dL (ref 0.3–1.2)
Total Protein: 6.1 g/dL — ABNORMAL LOW (ref 6.5–8.1)

## 2019-03-28 LAB — PHOSPHORUS: Phosphorus: 3.4 mg/dL (ref 2.5–4.6)

## 2019-03-28 LAB — GLUCOSE, CAPILLARY: Glucose-Capillary: 163 mg/dL — ABNORMAL HIGH (ref 70–99)

## 2019-03-28 LAB — MAGNESIUM: Magnesium: 2.2 mg/dL (ref 1.7–2.4)

## 2019-03-28 LAB — C-REACTIVE PROTEIN: CRP: 1.4 mg/dL — ABNORMAL HIGH (ref <1.0)

## 2019-03-28 LAB — FERRITIN: Ferritin: 1368 ng/mL — ABNORMAL HIGH (ref 24–336)

## 2019-03-28 LAB — D-DIMER, QUANTITATIVE: D-Dimer, Quant: 0.3 ug/mL-FEU (ref 0.00–0.50)

## 2019-03-28 MED ORDER — IPRATROPIUM-ALBUTEROL 20-100 MCG/ACT IN AERS
2.0000 | INHALATION_SPRAY | Freq: Four times a day (QID) | RESPIRATORY_TRACT | Status: DC | PRN
  Administered 2019-03-29: 2 via RESPIRATORY_TRACT
  Filled 2019-03-28: qty 4

## 2019-03-28 NOTE — Progress Notes (Signed)
[facility]                                                                              Patient Demographics  Frederick Williams, is a 58 y.o. male, DOB - 1961-03-22, KZS:[id number]  Admit date - 03/24/2019   Admitting Physician Truddie Hidden, MD  Outpatient Primary MD for the patient is Dettinger, Fransisca Kaufmann, MD  Outpatient specialists:   LOS - 4  days   Medical records reviewed and are as summarized below:    Chief Complaint  Patient presents with  . Covid  . Shortness of Breath       Brief summary   Patient is a 58 year old male with no significant medical history, presented with shortness of breath, cough and fevers in the setting of known COVID-19 pneumonia.  Patient reported that he was in his usual state of health up until last Monday a week ago.  Then he started having shortness of breath, coughing, fevers, generalized lethargy and fatigue.  Patient was placed on prednisone and Levaquin outpatient however symptoms progressively got worse in the last few days prompting him to seek medical attention.  He reported having anosmia as well.  Denied any diarrhea.  Also reported chest pain with coughing spells.  Patient ported that he could not tolerate oral prednisone because of severe reflux.  No prior history of blood clots.  Quit smoking 6 to 7 years ago. Patient reported that his wife has Covid.  Assessment & Plan    1. Acute Hypoxic Resp. Failure due to Acute Covid 19 Viral Pneumonitis during the ongoing 2020 Covid 19 Pandemic - POA -Proning at the time of my encounter, states he feels better after proning.  O2 sats 97% on 2.5 L.  States shortness of breath and coughing is getting better.   -Continue IV dexamethasone, remdesivir per protocol, will finish remdesivir on 11/22  -Overall improving, CRP improving, hold off on Actemra.  - Continue to wean oxygen, ambulatory O2 screening daily as tolerated - Continue supportive treatment with cough suppressants,  inhalers, vitamins and supportive care,  -Continue scheduled Combivent, butyryl inhaler as needed, incentive spirometry.   -Home O2 evaluation prior to discharge. Blood cultures negative so far - Continue to follow labs as below, inflammatory markers trending down.  Recent Labs  Lab 03/24/19 1424 03/25/19 0428 03/26/19 0504 03/27/19 0426 03/28/19 0500  DDIMER 0.93* 0.91* 0.66* 0.44 0.30  FERRITIN 1,735* 1,067* 977* 855* 1,368*  CRP 11.0* 10.7* 5.0* 1.9* 1.4*  ALT 35 29 42 42 140*  PROCALCITON <0.10  --   --   --   --      Malnutrition of moderate degree -cont nutritional supplements   Acute kidney injury  - Likely due to #1, creatinine 1.38 at the time of admission, was placed on gentle IV fluid hydration -Resolved  Code Status: Full CODE STATUS DVT Prophylaxis:  Lovenox Family Communication: Discussed all imaging results, lab results, explained to the patient's wife on the phone on 11/20   Disposition Plan: Pending clinical status, currently inpatient given acute hypoxic respiratory failure, acute COVID-19 illness. Once ambulatory without overt symptoms or hypoxia would consider discharge  home.  Will complete remdesivir on Sunday, likely DC home in a.m.  Time Spent in minutes 25 minutes  Procedures:  None  Consultants:   None  Antimicrobials:   Anti-infectives (From admission, onward)   Start     Dose/Rate Route Frequency Ordered Stop   03/26/19 1000  remdesivir 100 mg in sodium chloride 0.9 % 250 mL IVPB     100 mg 500 mL/hr over 30 Minutes Intravenous Every 24 hours 03/25/19 0949 03/30/19 0959   03/25/19 1030  remdesivir 200 mg in sodium chloride 0.9 % 250 mL IVPB     20 0 mg 500 mL/hr over 30 Minutes Intravenous Once 03/25/19 0949 03/25/19 1137         Medications  Scheduled Meds: . benzonatate  200 mg Oral TID  . chlorpheniramine-HYDROcodone  5 mL Oral Q12H  . dexamethasone (DECADRON) injection  6 mg Intravenous Q24H  . enoxaparin (LOVENOX) injection   40 mg Subcutaneous Q24H  . feeding supplement (ENSURE ENLIVE)  237 mL Oral BID BM  . Ipratropium-Albuterol  2 puff Inhalation QID  . pantoprazole  40 mg Oral BID  . vitamin C  500 mg Oral Daily  . zinc sulfate  220 mg Oral Daily   Continuous Infusions: . remdesivir 100 mg in NS 250 mL 100 mg (03/28/19 1034)   PRN Meds:.albuterol, guaiFENesin-dextromethorphan, HYDROcodone-acetaminophen, ondansetron **OR** ondansetron (ZOFRAN) IV      Subjective:   Brandley Aldrete was seen and examined today.  States he is tired of being in the hospital, proning so he can get better and go home soon.  Overall feels better from admission, cough is better, shortness of breath improving.  Still feels somewhat winded on ambulation but improving from admission.  No fevers or chills.   No nausea vomiting or abdominal pain.  No acute events overnight.    Objective:   Vitals:   03/27/19 1142 03/27/19 2049 03/28/19 0511 03/28/19 0802  BP: (!) 103/91 135/77 124/84   Pulse: 72 66 69   Resp: 15 20    Temp: 97.7 F (36.5 C) 97.6 F (36.4 C) 97.9 F (36.6 C)   TempSrc: Oral Oral Oral   SpO2: 100% 98% 100% 97%  Weight:      Height:        Intake/Output Summary (Last 24 hours) at 03/28/2019 1321 Last data filed at 03/28/2019 0600 Gross per 24 hour  Intake 1210 ml  Output -  Net 1210 ml     Wt Readings from Last 3 Encounters:  03/25/19 80.5 kg  03/22/19 83.9 kg  04/10/18 83.5 kg   Physical Exam  General: Alert and oriented x 3, NAD  Eyes:   HEENT:  Atraumatic, normocephalic  Cardiovascular: S1 S2 clear, no murmurs, RRR. No pedal edema b/l  Respiratory: Wheezing much improved, decreased breath sound at the bases  Gastrointestinal: Soft, nontender, nondistended, NBS  Ext: no pedal edema bilaterally  Neuro: no new deficits  Musculoskeletal: No cyanosis, clubbing  Skin: No rashes  Psych: Normal affect and demeanor, alert and oriented x3   Data Reviewed:  I have personally reviewed  following labs and imaging studies  Micro Results Recent Results (from the past 240 hour(s))  Blood Culture (routine x 2)     Status: None (Preliminary result)   Collection Time: 03/24/19  2:24 PM   Specimen: BLOOD  Result Value Ref Range Status   Specimen Description   Final    BLOOD LEFT ANTECUBITAL Performed at [facility], [address] [place], [place] [address]    Special Requests   Final    BOTTLES DRAWN AEROBIC AND ANAEROBIC Blood Culture adequate volume Performed at [facility], [address] [place], [place] [address]    Culture   Final    NO GROWTH 4 DAYS Performed at [facility], [address], [place], [place] [address]    Report Status PENDING  Incomplete  Blood Culture (routine x 2)     Status: None (Preliminary result)   Collection Time: 03/24/19  2:24 PM   Specimen: BLOOD LEFT HAND  Result Value Ref Range Status   Specimen Description   Final    BLOOD LEFT HAND Performed at [facility], [address], [place], [place] [address]    Special Requests   Final    BOTTLES DRAWN AEROBIC ONLY Blood Culture results may not be optimal due to an inadequate volume of blood received in culture bottles Performed at [facility], [address], [place], [place] [address]    Culture   Final    NO GROWTH 4 DAYS Performed at [facility], [address] [place], [place] [address]    Report Status PENDING  Incomplete  SARS CORONAVIRUS 2 (TAT 6-24 HRS) Nasopharyngeal Nasopharyngeal Swab     Status: Abnormal   Collection Time: 03/25/19 11:11 AM   Specimen: Nasopharyngeal Swab  Result Value Ref Range Status   SARS Coronavirus 2 POSITIVE (A) NEGATIVE Final    Comment: RESULT CALLED TO, READ BACK BY AND VERIFIED WITH: S.JOHNSON,RN [id number] I.MANNING (NOTE) SARS-CoV-2 target nucleic acids are DETECTED. The SARS-CoV-2 RNA is generally detectable in upper and lower  respiratory specimens during the acute phase of infection. Positive results are indicative of active infection with SARS-CoV-2. Clinical  correlation with patient history and other diagnostic information is necessary to determine patient infection status. Positive results do  not rule out bacterial infection or co-infection with other viruses. The expected result is Negative. Fact Sheet for Patients: HairSlick.no Fact Sheet for Healthcare Providers: quierodirigir.com This test is not yet approved or cleared by the [place] FDA and  has been authorized for detection and/or diagnosis of SARS-CoV-2 by FDA under an Emergency Use Authorization (EUA). This EUA will remain  in effect (meaning this test can be used) for  the duration of the COVID-19 declaration under Section 564(b)(1) of the Act, 21 U.S.C. section 360bbb-3(b)(1), unless the authorization is terminated or revoked sooner. Performed at [facility], [address] [place], [place] [address]     Radiology Reports Dg Chest [facility] 1 View  Result Date: 03/24/2019 CLINICAL DATA:  Chest pain, shortness of breath EXAM: PORTABLE CHEST 1 VIEW COMPARISON:  03/22/2019 FINDINGS: No significant change in heterogeneous bilateral pulmonary opacity. The heart and mediastinum are unremarkable. IMPRESSION: No significant change in heterogeneous bilateral pulmonary opacity, in keeping with reported COVID-19 diagnosis. Electronically Signed   By: Lauralyn Primes M.D.   On: 03/24/2019 13:55   Dg Chest Portable 1 View  Result Date: 03/22/2019 CLINICAL DATA:  COVID-19 positive, fever, dyspnea EXAM: PORTABLE CHEST 1 VIEW COMPARISON:  04/10/2018 FINDINGS: The heart size and mediastinal contours are within normal limits. Diffusely scattered heterogeneous airspace opacity about the lungs. The visualized skeletal structures are unremarkable. IMPRESSION: Diffusely scattered heterogeneous airspace  opacity about the lungs, in keeping with reported COVID-19 infection. Electronically Signed   By: Lauralyn Primes M.D.   On: 03/22/2019 15:02    Lab  Data:  CBC: Recent Labs  Lab 03/24/19 1424 03/24/19 1840 03/25/19 0428 03/26/19 0504 03/27/19 0426 03/28/19 0500  WBC 5.1 5.4 2.4* 7.7 10.6* 9.4  NEUTROABS 3.7  --  1.7 6.2 9.0* 8.2*  HGB 17.4* 16.5 16.0 15.5 15.0 14.8  HCT 51.6 49.0 46.9 45.8 44.8 45.2  MCV 93.6 94.2 92.9 93.5 94.3 95.4  PLT 143* 140* 141* 172 199 232   Basic Metabolic Panel: Recent Labs  Lab 03/24/19 1424 03/24/19 1840 03/25/19 0428 03/26/19 0504 03/27/19 0426 03/28/19 0500  NA 136  --  135 138 139 140  K 4.5  --  4.4 4.8 4.5 5.0  CL 101  --  104 105 106 103  CO2 25  --  21* 23 23 28   GLUCOSE 97  --  179* 158* 150* 164*  BUN 16  --  17 24* 24* 23*  CREATININE 1.38* 1.18 0.99 0.89 0.88 0.93  CALCIUM 8.3*  --  8.1* 8.3* 8.2* 8.5*  MG  --   --  2.2 2.3 2.0 2.2  PHOS  --   --  2.1* 2.7 2.8 3.4   GFR: Estimated Creatinine Clearance: 95 mL/min (by C-G formula based on SCr of 0.93 mg/dL). Liver Function Tests: Recent Labs  Lab 03/24/19 1424 03/25/19 0428 03/26/19 0504 03/27/19 0426 03/28/19 0500  AST 40 34 39 28 83*  ALT 35 29 42 42 140*  ALKPHOS 54 49 53 48 55  BILITOT 0.7 0.5 0.3 0.4 0.5  PROT 7.1 6.4* 6.2* 5.8* 6.1*  ALBUMIN 3.7 3.1* 3.2* 2.8* 3.2*   No results for input(s): LIPASE, AMYLASE in the last 168 hours. No results for input(s): AMMONIA in the last 168 hours. Coagulation Profile: No results for input(s): INR, PROTIME in the last 168 hours. Cardiac Enzymes: No results for input(s): CKTOTAL, CKMB, CKMBINDEX, TROPONINI in the last 168 hours. BNP (last 3 results) No results for input(s): PROBNP in the last 8760 hours. HbA1C: No results for input(s): HGBA1C in the last 72 hours. CBG: No results for input(s): GLUCAP in the last 168 hours. Lipid Profile: No results for input(s): CHOL, HDL, LDLCALC, TRIG, CHOLHDL, LDLDIRECT in the last  72 hours. Thyroid Function Tests: No results for input(s): TSH, T4TOTAL, FREET4, T3FREE, THYROIDAB in the last 72 hours. Anemia Panel: Recent Labs    03/27/19 0426 03/28/19 0500  FERRITIN 855* 1,368*   Urine analysis: No results found for: COLORURINE, APPEARANCEUR, LABSPEC, PHURINE, GLUCOSEU, HGBUR, BILIRUBINUR, KETONESUR, PROTEINUR, UROBILINOGEN, NITRITE, LEUKOCYTESUR     M.D. [facility] 03/28/2019, 1:21 PM   Call night coverage person covering after 7pm

## 2019-03-29 DIAGNOSIS — J209 Acute bronchitis, unspecified: Secondary | ICD-10-CM

## 2019-03-29 LAB — CBC WITH DIFFERENTIAL/PLATELET
Abs Immature Granulocytes: 0.13 10*3/uL — ABNORMAL HIGH (ref 0.00–0.07)
Basophils Absolute: 0 10*3/uL (ref 0.0–0.1)
Basophils Relative: 0 %
Eosinophils Absolute: 0 10*3/uL (ref 0.0–0.5)
Eosinophils Relative: 0 %
HCT: 46.5 % (ref 39.0–52.0)
Hemoglobin: 15.2 g/dL (ref 13.0–17.0)
Immature Granulocytes: 1 %
Lymphocytes Relative: 6 %
Lymphs Abs: 0.7 10*3/uL (ref 0.7–4.0)
MCH: 31.5 pg (ref 26.0–34.0)
MCHC: 32.7 g/dL (ref 30.0–36.0)
MCV: 96.3 fL (ref 80.0–100.0)
Monocytes Absolute: 0.5 10*3/uL (ref 0.1–1.0)
Monocytes Relative: 4 %
Neutro Abs: 10.1 10*3/uL — ABNORMAL HIGH (ref 1.7–7.7)
Neutrophils Relative %: 89 %
Platelets: 207 10*3/uL (ref 150–400)
RBC: 4.83 MIL/uL (ref 4.22–5.81)
RDW: 11.6 % (ref 11.5–15.5)
WBC: 11.5 10*3/uL — ABNORMAL HIGH (ref 4.0–10.5)
nRBC: 0 % (ref 0.0–0.2)

## 2019-03-29 LAB — C-REACTIVE PROTEIN: CRP: 0.9 mg/dL (ref <1.0)

## 2019-03-29 LAB — D-DIMER, QUANTITATIVE: D-Dimer, Quant: 0.44 ug/mL-FEU (ref 0.00–0.50)

## 2019-03-29 LAB — CULTURE, BLOOD (ROUTINE X 2)
Culture: NO GROWTH
Culture: NO GROWTH
Special Requests: ADEQUATE

## 2019-03-29 LAB — COMPREHENSIVE METABOLIC PANEL
ALT: 103 U/L — ABNORMAL HIGH (ref 0–44)
AST: 37 U/L (ref 15–41)
Albumin: 3.2 g/dL — ABNORMAL LOW (ref 3.5–5.0)
Alkaline Phosphatase: 59 U/L (ref 38–126)
Anion gap: 9 (ref 5–15)
BUN: 21 mg/dL — ABNORMAL HIGH (ref 6–20)
CO2: 28 mmol/L (ref 22–32)
Calcium: 8.7 mg/dL — ABNORMAL LOW (ref 8.9–10.3)
Chloride: 102 mmol/L (ref 98–111)
Creatinine, Ser: 0.99 mg/dL (ref 0.61–1.24)
GFR calc Af Amer: >60 mL/min (ref >60)
GFR calc non Af Amer: >60 mL/min (ref >60)
Glucose, Bld: 167 mg/dL — ABNORMAL HIGH (ref 70–99)
Potassium: 5.2 mmol/L — ABNORMAL HIGH (ref 3.5–5.1)
Sodium: 139 mmol/L (ref 135–145)
Total Bilirubin: 0.3 mg/dL (ref 0.3–1.2)
Total Protein: 6.1 g/dL — ABNORMAL LOW (ref 6.5–8.1)

## 2019-03-29 LAB — FERRITIN: Ferritin: 968 ng/mL — ABNORMAL HIGH (ref 24–336)

## 2019-03-29 LAB — PHOSPHORUS: Phosphorus: 2.9 mg/dL (ref 2.5–4.6)

## 2019-03-29 LAB — MAGNESIUM: Magnesium: 2.1 mg/dL (ref 1.7–2.4)

## 2019-03-29 MED ORDER — ASCORBIC ACID 500 MG PO TABS: 500.0000 mg | ORAL_TABLET | Freq: Every day | ORAL | 0 refills | Status: DC

## 2019-03-29 MED ORDER — BENZONATATE 200 MG PO CAPS: 200.0000 mg | ORAL_CAPSULE | Freq: Three times a day (TID) | ORAL | 0 refills | Status: DC | PRN

## 2019-03-29 MED ORDER — HYDROCOD POLST-CPM POLST ER 10-8 MG/5ML PO SUER: 5.0000 mL | Freq: Two times a day (BID) | ORAL | 0 refills | Status: DC | PRN

## 2019-03-29 MED ORDER — ALBUTEROL SULFATE HFA 108 (90 BASE) MCG/ACT IN AERS: 1.0000 | INHALATION_SPRAY | RESPIRATORY_TRACT | 1 refills | Status: DC | PRN

## 2019-03-29 MED ORDER — DEXAMETHASONE 6 MG PO TABS: 6.0000 mg | ORAL_TABLET | Freq: Every day | ORAL | 0 refills | Status: AC

## 2019-03-29 MED ORDER — PANTOPRAZOLE SODIUM 40 MG PO TBEC: 40.0000 mg | DELAYED_RELEASE_TABLET | Freq: Two times a day (BID) | ORAL | 0 refills | Status: DC

## 2019-03-29 NOTE — Progress Notes (Signed)
Patient on room air at rest oxygen level 98%, while ambulating oxygen level remaining between 90-96% on room air. Patient denied shortness of breath or difficulty ambulating approximately 170 ft.

## 2019-03-29 NOTE — Discharge Instructions (Signed)

## 2019-03-29 NOTE — Discharge Summary (Signed)
Physician Discharge Summary   Patient ID: Frederick Williams MRN: [id number] DOB/AGE: Feb 28, 1961 58 y.o.  Admit date: 03/24/2019 Discharge date: 03/29/2019  Primary Care Physician:  Dettinger, Elige Radon, MD   Recommendations for Outpatient Follow-up:  1. Follow up with PCP in 1-2 weeks  Home Health: None  Equipment/Devices:   Discharge Condition: stable  CODE STATUS: FULL   Diet recommendation: Heart healthy diet   Discharge Diagnoses:    Acute hypoxic respiratory failure due to acute COVID-19 viral pneumonitis . Pneumonia due to COVID-19 virus Malnutrition of moderate degree Acute kidney injury GERD  Consults: None    Allergies:   Allergies  Allergen Reactions  . Ciprofloxacin     Tendon pain  . Prednisone Other (See Comments)    Severe abd cramping  . Penicillins Rash     DISCHARGE MEDICATIONS: Allergies as of 03/29/2019      Reactions   Ciprofloxacin    Tendon pain   Prednisone Other (See Comments)   Severe abd cramping   Penicillins Rash      Medication List    STOP taking these medications   Hydromet 5-1.5 MG/5ML syrup Generic drug: HYDROcodone-homatropine   levofloxacin 500 MG tablet Commonly known as: LEVAQUIN   predniSONE 10 MG tablet Commonly known as: DELTASONE     TAKE these medications   albuterol 108 (90 Base) MCG/ACT inhaler Commonly known as: VENTOLIN HFA Inhale 1-2 puffs into the lungs every 2 (two) hours as needed for wheezing or shortness of breath. Notes to patient: 03/29/19   ascorbic acid 500 MG tablet Commonly known as: VITAMIN C Take 1 tablet (500 mg total) by mouth daily. Start taking on: March 30, 2019   benzonatate 200 MG capsule Commonly known as: Lawyer Take 1 capsule (200 mg total) by mouth 3 (three) times daily as needed for cough. What changed:   medication strength  how much to take Notes to patient: 03/29/19 @ 4:00 pm    chlorpheniramine-HYDROcodone 10-8 MG/5ML Suer Commonly known as:  TUSSIONEX Take 5 mLs by mouth every 12 (twelve) hours as needed for cough. Notes to patient: 03/29/19 @ 10:00 pm   dexamethasone 6 MG tablet Commonly known as: DECADRON Take 1 tablet (6 mg total) by mouth daily for 5 days. Notes to patient: 03/29/19 @ 4: 00  pm   pantoprazole 40 MG tablet Commonly known as: PROTONIX Take 1 tablet (40 mg total) by mouth 2 (two) times daily. While taking dexamethasone Notes to patient: 03/29/19 @ 10:00 pm        Brief H and P: For complete details please refer to admission H and P, but in brief Patient is a 58 year old male with no significant medical history, presented with shortness of breath, cough and fevers in the setting of known COVID-19 pneumonia.  Patient reported that he was in his usual state of health up until last Monday a week ago.  Then he started having shortness of breath, coughing, fevers, generalized lethargy and fatigue.  Patient was placed on prednisone and Levaquin outpatient however symptoms progressively got worse in the last few days prompting him to seek medical attention.  He reported having anosmia as well.  Denied any diarrhea.  Also reported chest pain with coughing spells.  Patient ported that he could not tolerate oral prednisone because of severe reflux.  No prior history of blood clots.  Quit smoking 6 to 7 years ago.  Hospital Course:   1. Acute Hypoxic Resp. Failure due to Acute Covid 19 Viral  Pneumonitis during the ongoing 2020 Covid 19 Pandemic - POA -Patient was admitted with acute respiratory failure with hypoxia secondary to acute COVID-19 viral pneumonitis.  During hospitalization required O2 supplementation, improving progressively with proning.  Home O2 evaluation done prior to discharge, patient has done well and does not qualify for O2.   -Patient was placed on IV dexamethasone and remdesivir per protocol, finishes remdesivir dose today.  Transition to oral prednisone to complete full course for 10 days. -Overall  improving, CRP improving, hold off on Actemra.  - Continue to wean oxygen, ambulatory O2 screening daily as tolerated - Continue supportive treatment with cough suppressants, inhalers, vitamins and supportive care,  -Continue scheduled Combivent, butyryl inhaler as needed, incentive spirometry.   -Home O2 evaluation prior to discharge. Blood cultures negative so far - Continue to follow labs as below, inflammatory markers trending down.  Last Labs          Recent Labs  Lab 03/24/19 1424 03/25/19 0428 03/26/19 0504 03/27/19 0426 03/28/19 0500  DDIMER 0.93* 0.91* 0.66* 0.44 0.30  FERRITIN 1,735* 1,067* 977* 855* 1,368*  CRP 11.0* 10.7* 5.0* 1.9* 1.4*  ALT 35 29 42 42 140*  PROCALCITON <0.10  --   --   --   --        Malnutrition of moderate degree -cont nutritional supplements   Acute kidney injury  - Likely due to #1, creatinine 1.38 at the time of admission, was placed on gentle IV fluid hydration -Resolved  GERD Patient complained of significant GERD with steroids, patient was placed on PPI   Day of Discharge S: Feels a lot better, waiting to complete remdesivir dose and go home.  No fevers or chills.  Breathing better.  Off O2  BP 113/80 (BP Location: Right Arm)   Pulse 69   Temp (!) 97.5 F (36.4 C) (Oral)   Resp 20   Ht 6' (1.829 m)   Wt 80.5 kg   SpO2 100%   BMI 24.07 kg/m   Physical Exam: General: Alert and awake oriented x3 not in any acute distress. HEENT: anicteric sclera, pupils reactive to light and accommodation CVS: S1-S2 clear no murmur rubs or gallops Chest: clear to auscultation bilaterally, no wheezing rales or rhonchi Abdomen: soft nontender, nondistended, normal bowel sounds Extremities: no cyanosis, clubbing or edema noted bilaterally Neuro: Cranial nerves II-XII intact, no focal neurological deficits   The results of significant diagnostics from this hospitalization (including imaging, microbiology, ancillary and laboratory) are  listed below for reference.      Procedures/Studies:  Dg Chest Port 1 View  Result Date: 03/24/2019 CLINICAL DATA:  Chest pain, shortness of breath EXAM: PORTABLE CHEST 1 VIEW COMPARISON:  03/22/2019 FINDINGS: No significant change in heterogeneous bilateral pulmonary opacity. The heart and mediastinum are unremarkable. IMPRESSION: No significant change in heterogeneous bilateral pulmonary opacity, in keeping with reported COVID-19 diagnosis. Electronically Signed   By: Eddie Candle M.D.   On: 03/24/2019 13:55   Dg Chest Portable 1 View  Result Date: 03/22/2019 CLINICAL DATA:  COVID-19 positive, fever, dyspnea EXAM: PORTABLE CHEST 1 VIEW COMPARISON:  04/10/2018 FINDINGS: The heart size and mediastinal contours are within normal limits. Diffusely scattered heterogeneous airspace opacity about the lungs. The visualized skeletal structures are unremarkable. IMPRESSION: Diffusely scattered heterogeneous airspace opacity about the lungs, in keeping with reported COVID-19 infection. Electronically Signed   By: Eddie Candle M.D.   On: 03/22/2019 15:02       LAB RESULTS: Basic Metabolic Panel: Recent Labs  Lab 03/28/19 0500 03/29/19 0314  NA 140 139  K 5.0 5.2*  CL 103 102  CO2 28 28  GLUCOSE 164* 167*  BUN 23* 21*  CREATININE 0.93 0.99  CALCIUM 8.5* 8.7*  MG 2.2 2.1  PHOS 3.4 2.9   Liver Function Tests: Recent Labs  Lab 03/28/19 0500 03/29/19 0314  AST 83* 37  ALT 140* 103*  ALKPHOS 55 59  BILITOT 0.5 0.3  PROT 6.1* 6.1*  ALBUMIN 3.2* 3.2*   No results for input(s): LIPASE, AMYLASE in the last 168 hours. No results for input(s): AMMONIA in the last 168 hours. CBC: Recent Labs  Lab 03/28/19 0500 03/29/19 0314  WBC 9.4 11.5*  NEUTROABS 8.2* 10.1*  HGB 14.8 15.2  HCT 45.2 46.5  MCV 95.4 96.3  PLT 232 207   Cardiac Enzymes: No results for input(s): CKTOTAL, CKMB, CKMBINDEX, TROPONINI in the last 168 hours. BNP: Invalid input(s): POCBNP CBG: Recent Labs  Lab  03/28/19 2101  GLUCAP 163*      Disposition and Follow-up: Discharge Instructions    (HEART FAILURE PATIENTS) Call MD:  Anytime you have any of the following symptoms: 1) 3 pound weight gain in 24 hours or 5 pounds in 1 week 2) shortness of breath, with or without a dry hacking cough 3) swelling in the hands, feet or stomach 4) if you have to sleep on extra pillows at night in order to breathe.   Complete by: As directed    Call MD for:  difficulty breathing, headache or visual disturbances   Complete by: As directed    Call MD for:  extreme fatigue   Complete by: As directed    Call MD for:  persistant dizziness or light-headedness   Complete by: As directed    Call MD for:  persistant nausea and vomiting   Complete by: As directed    Call MD for:  temperature >100.4   Complete by: As directed    Diet - low sodium heart healthy   Complete by: As directed    Increase activity slowly   Complete by: As directed    MyChart COVID-19 home monitoring program   Complete by: Mar 29, 2019    Is the patient willing to use the MyChart Mobile App for home monitoring?: Yes   Temperature monitoring   Complete by: Mar 29, 2019    After how many days would you like to receive a notification of this patient's flowsheet entries?: 1       DISPOSITION: Home   DISCHARGE FOLLOW-UP Follow-up Information    Dettinger, Elige RadonJoshua A, MD. Schedule an appointment as soon as possible for a visit in 2 week(s).   Specialties: Family Medicine, Cardiology Contact information: [address] [place] [address] [phone]            Time coordinating discharge:  35 minutes  Signed:   Thad Rangeripudeep Worley Radermacher M.D. [facility] 03/29/2019, 12:16 PM

## 2019-03-29 NOTE — Progress Notes (Signed)
Discharge instructions given to patient utilizing teach back method no questions at this time patient discharged to home.

## 2019-04-06 ENCOUNTER — Encounter: Payer: Self-pay | Admitting: Family

## 2019-04-06 ENCOUNTER — Ambulatory Visit (INDEPENDENT_AMBULATORY_CARE_PROVIDER_SITE_OTHER): Payer: Managed Care, Other (non HMO) | Admitting: Family

## 2019-04-06 DIAGNOSIS — J1289 Other viral pneumonia: Secondary | ICD-10-CM | POA: Diagnosis not present

## 2019-04-06 DIAGNOSIS — M79605 Pain in left leg: Secondary | ICD-10-CM

## 2019-04-06 DIAGNOSIS — U071 COVID-19: Secondary | ICD-10-CM | POA: Diagnosis not present

## 2019-04-06 NOTE — Progress Notes (Addendum)
   Virtual Visit via telephone Note Due to COVID-19 pandemic this visit was conducted virtually. This visit type was conducted due to national recommendations for restrictions regarding the COVID-19 Pandemic (e.g. social distancing, sheltering in place) in an effort to limit this patient's exposure and mitigate transmission in our community. All issues noted in this document were discussed and addressed.  A physical exam was not performed with this format.  I connected with Frederick Williams on 04/06/19 at 12:50 pm by telephone and verified that I am speaking with the correct person using two identifiers. Frederick Williams is currently located at home and wife is currently with him during visit. The provider, Evelina Dun, FNP is located in their office at time of visit.  I discussed the limitations, risks, security and privacy concerns of performing an evaluation and management service by telephone and the availability of in person appointments. I also discussed with the patient that there may be a patient responsible charge related to this service. The patient expressed understanding and agreed to proceed.   History and Present Illness:  HPI Pt calls the office today with left leg pain. He was diagnosed with COVID on 03/16/19. He then was admitted to the ED on 03/24/19 and was discharged on 03/29/19. He states his left leg started hurting 04/01/19. He states the pain has slightly started improving. He continues to have SOB and his O2 at home is low 90's.   Review of Systems  Constitutional: Positive for malaise/fatigue.  Respiratory: Positive for shortness of breath.   All other systems reviewed and are negative.    Observations/Objective: No SOB or distress noted  Assessment and Plan: Frederick Williams comes in today with chief complaint of No chief complaint on file.   Diagnosis and orders addressed:  1. COVID-19 - US Venous Img Lower Unilateral Left; Future - CMP14+EGFR; Future - CBC with  Differential/Platelet; Future - DG Chest 2 View; Future  2. Left leg pain Will get stat doppler to rule out DVT - US Venous Img Lower Unilateral Left; Future - CMP14+EGFR; Future - CBC with Differential/Platelet; Future  3. Pneumonia due to COVID-19 virus Labs pending Chest x-ray pending Discussed if symptoms worsen go back to ED!!!!!! - CMP14+EGFR; Future - CBC with Differential/Platelet; Future - DG Chest 2 View; Future   Addendum: Pt can not be scheduled outpt. He will go to ED for doppler to rule out DVT.       I discussed the assessment and treatment plan with the patient. The patient was provided an opportunity to ask questions and all were answered. The patient agreed with the plan and demonstrated an understanding of the instructions.   The patient was advised to call back or seek an in-person evaluation if the symptoms worsen or if the condition fails to improve as anticipated.  The above assessment and management plan was discussed with the patient. The patient verbalized understanding of and has agreed to the management plan. Patient is aware to call the clinic if symptoms persist or worsen. Patient is aware when to return to the clinic for a follow-up visit. Patient educated on when it is appropriate to go to the emergency department.   Time call ended:  1:10 pm  I provided 20 minutes of non-face-to-face time during this encounter.    Evelina Dun, FNP

## 2019-04-07 ENCOUNTER — Telehealth: Payer: Self-pay | Admitting: Family Medicine

## 2019-04-07 ENCOUNTER — Emergency Department (HOSPITAL_COMMUNITY)
Admission: EM | Admit: 2019-04-07 | Discharge: 2019-04-07 | Disposition: A | Discharge status: Home / Self Care | Payer: 59 | Attending: Emergency Medicine | Admitting: Emergency Medicine

## 2019-04-07 ENCOUNTER — Emergency Department (HOSPITAL_BASED_OUTPATIENT_CLINIC_OR_DEPARTMENT_OTHER)
Admit: 2019-04-07 | Discharge: 2019-04-07 | Disposition: A | Discharge status: Home / Self Care | Payer: 59 | Attending: Emergency Medicine | Admitting: Emergency Medicine

## 2019-04-07 ENCOUNTER — Emergency Department (HOSPITAL_COMMUNITY): Payer: 59

## 2019-04-07 ENCOUNTER — Encounter (HOSPITAL_COMMUNITY): Payer: Self-pay

## 2019-04-07 DIAGNOSIS — U071 COVID-19: Secondary | ICD-10-CM | POA: Diagnosis not present

## 2019-04-07 DIAGNOSIS — M79605 Pain in left leg: Secondary | ICD-10-CM | POA: Insufficient documentation

## 2019-04-07 DIAGNOSIS — J1289 Other viral pneumonia: Secondary | ICD-10-CM | POA: Insufficient documentation

## 2019-04-07 DIAGNOSIS — R52 Pain, unspecified: Secondary | ICD-10-CM | POA: Diagnosis not present

## 2019-04-07 DIAGNOSIS — R0602 Shortness of breath: Secondary | ICD-10-CM

## 2019-04-07 DIAGNOSIS — Z87891 Personal history of nicotine dependence: Secondary | ICD-10-CM | POA: Diagnosis not present

## 2019-04-07 DIAGNOSIS — Z79899 Other long term (current) drug therapy: Secondary | ICD-10-CM | POA: Insufficient documentation

## 2019-04-07 NOTE — ED Provider Notes (Signed)
[facility] DEPT Provider Note   CSN: [id number] Arrival date & time: 04/07/19  0800     History   Chief Complaint Chief Complaint  Patient presents with  . Leg Pain    POSTIVE on 18th?  . Shortness of Breath    HPI Frederick Williams is a 58 y.o. male with a history of Covid pneumonia (symptom onset November 9) with hospitalization and subsequent dosage of remdesivir and Decadron, presenting to the ED with pain in his left leg.  He reports that since discharge home several days ago he has had pain in the posterior side of his left leg, near the upper left leg but extending down to the knee.  He says at times it is so bad he feels he cannot stand on the leg.  He was seen by his PCP who referred him into the ED for a stat Doppler ultrasound, as he was unable to obtain it as an outpatient due to his Covid status.  Patient also continues to complain of shortness of breath, which is largely dyspnea with exertion.  He says this is been at baseline since leaving the hospital.  He wears a pulse ox at home and says he tends to run 90 to 93%.  He does feel winded as walking to the bathroom.  Since the weight fell to the hospital.  Denies fevers or chills.  Continues to have a cough.     HPI  History reviewed. No pertinent past medical history.  Patient Active Problem List   Diagnosis Date Noted  . Malnutrition of moderate degree 03/25/2019  . Pneumonia due to COVID-19 virus 03/24/2019    History reviewed. No pertinent surgical history.      Home Medications    Prior to Admission medications   Medication Sig Start Date End Date Taking? Authorizing Provider  albuterol (VENTOLIN HFA) 108 (90 Base) MCG/ACT inhaler Inhale 1-2 puffs into the lungs every 2 (two) hours as needed for wheezing or shortness of breath. 03/29/19   Rai, Vernelle Emerald, MD  benzonatate (TESSALON) 200 MG capsule Take 1 capsule (200 mg total) by mouth 3 (three) times daily as needed for cough.  03/29/19   Rai, Vernelle Emerald, MD  chlorpheniramine-HYDROcodone (TUSSIONEX) 10-8 MG/5ML SUER Take 5 mLs by mouth every 12 (twelve) hours as needed for cough. 03/29/19   Rai, Ripudeep K, MD  pantoprazole (PROTONIX) 40 MG tablet Take 1 tablet (40 mg total) by mouth 2 (two) times daily. While taking dexamethasone 03/29/19   Rai, Ripudeep K, MD  vitamin C (VITAMIN C) 500 MG tablet Take 1 tablet (500 mg total) by mouth daily. 03/30/19   Mendel Corning, MD    Family History Family History  Problem Relation Age of Onset  . Dementia Mother   . Hypertension Sister     Social History Social History   Tobacco Use  . Smoking status: Former Research scientist (life sciences)  . Smokeless tobacco: Former Systems developer    Quit date: 02/02/2011  Substance Use Topics  . Alcohol use: Yes  . Drug use: No     Allergies   Ciprofloxacin, Prednisone, and Penicillins   Review of Systems Review of Systems  Constitutional: Positive for fatigue. Negative for chills and fever.  Eyes: Negative for pain and visual disturbance.  Respiratory: Positive for shortness of breath. Negative for cough.   Cardiovascular: Positive for leg swelling. Negative for chest pain.  Gastrointestinal: Negative for vomiting.  Genitourinary: Negative for dysuria and hematuria.  Musculoskeletal: Positive for  arthralgias and myalgias.  Skin: Negative for pallor and rash.  Neurological: Negative for syncope and light-headedness.  Psychiatric/Behavioral: Negative for agitation and confusion.  All other systems reviewed and are negative.    Physical Exam Updated Vital Signs BP 118/90   Pulse 80   Temp 97.8 F (36.6 C) (Oral)   Resp 15   SpO2 95%   Physical Exam Vitals signs and nursing note reviewed.  Constitutional:      Appearance: He is well-developed.  HENT:     Head: Normocephalic and atraumatic.  Eyes:     Conjunctiva/sclera: Conjunctivae normal.  Neck:     Musculoskeletal: Neck supple.  Cardiovascular:     Rate and Rhythm: Normal rate and  regular rhythm.     Heart sounds: No murmur.  Pulmonary:     Effort: Pulmonary effort is normal. No respiratory distress.     Comments: 95% on room air Crackles in lung bases Abdominal:     Palpations: Abdomen is soft.     Tenderness: There is no abdominal tenderness.  Musculoskeletal:     Right lower leg: No edema.     Left lower leg: He exhibits tenderness. No edema.  Skin:    General: Skin is warm and dry.  Neurological:     General: No focal deficit present.     Mental Status: He is alert and oriented to person, place, and time.  Psychiatric:        Mood and Affect: Mood normal.        Behavior: Behavior normal.      ED Treatments / Results  Labs (all labs ordered are listed, but only abnormal results are displayed) Labs Reviewed - No data to display  EKG EKG Interpretation  Date/Time:  Tuesday April 07 2019 08:23:05 EST Ventricular Rate:  82 PR Interval:    QRS Duration: 83 QT Interval:  366 QTC Calculation: 428 R Axis:   54 Text Interpretation: Sinus rhythm Abnormal R-wave progression, early transition No STEMI, BEL in V2 V3 Confirmed by Alvester Chou [phone]) on 04/07/2019 9:42:03 AM   Radiology Dg Chest Portable 1 View  Result Date: 04/07/2019 CLINICAL DATA:  Shortness of breath on exertion, history of prior COVID-19 positivity EXAM: PORTABLE CHEST 1 VIEW COMPARISON:  03/24/2019 FINDINGS: Cardiac shadow is stable. Previously seen pulmonary opacities have resolved in the interval. No sizable effusion is seen. No new focal infiltrate is noted. No bony abnormality is seen. IMPRESSION: Resolution of previously seen parenchymal opacities. No new focal abnormality is noted. Electronically Signed   By: Alcide Clever M.D.   On: 04/07/2019 09:14   Vas [place] Lower Extremity Venous (dvt) (only Mc & Wl 7a-7p)  Result Date: 04/07/2019  Lower Venous Study Indications: Pain.  Risk Factors: COVID 19 positive. Comparison Study: No prior studies. Performing Technologist: Chanda Busing RVT  Examination Guidelines: A complete evaluation includes B-mode imaging, spectral Doppler, color Doppler, and power Doppler as needed of all accessible portions of each vessel. Bilateral testing is considered an integral part of a complete examination. Limited examinations for reoccurring indications may be performed as noted.  +-----+---------------+---------+-----------+----------+--------------+ RIGHTCompressibilityPhasicitySpontaneityPropertiesThrombus Aging +-----+---------------+---------+-----------+----------+--------------+ CFV  Full           Yes      Yes                                 +-----+---------------+---------+-----------+----------+--------------+   +---------+---------------+---------+-----------+----------+--------------+ LEFT     CompressibilityPhasicitySpontaneityPropertiesThrombus Aging +---------+---------------+---------+-----------+----------+--------------+  CFV      Full           Yes      Yes                                 +---------+---------------+---------+-----------+----------+--------------+ SFJ      Full                                                        +---------+---------------+---------+-----------+----------+--------------+ FV Prox  Full                                                        +---------+---------------+---------+-----------+----------+--------------+ FV Mid   Full                                                        +---------+---------------+---------+-----------+----------+--------------+ FV DistalFull                                                        +---------+---------------+---------+-----------+----------+--------------+ PFV      Full                                                        +---------+---------------+---------+-----------+----------+--------------+ POP      Full           Yes      Yes                                  +---------+---------------+---------+-----------+----------+--------------+ PTV      Full                                                        +---------+---------------+---------+-----------+----------+--------------+ PERO     Full                                                        +---------+---------------+---------+-----------+----------+--------------+     Summary: Right: No evidence of common femoral vein obstruction. Left: There is no evidence of deep vein thrombosis in the lower extremity. No cystic structure found in the popliteal fossa.  *See table(s) above for measurements and observations. Electronically signed by Waverly Ferrarihristopher Dickson MD on 04/07/2019 at 12:20:22 PM.  Final     Procedures Procedures (including critical care time)  Medications Ordered in ED Medications - No data to display   Initial Impression / Assessment and Plan / ED Course  I have reviewed the triage vital signs and the nursing notes.  Pertinent labs & imaging results that were available during my care of the patient were reviewed by me and considered in my medical decision making (see chart for details).  58 year old male with a recent hospitalization for Covid pneumonia, presenting to the ED with posterior left leg pain, referred in by his primary care provider for DVT ultrasound to rule out blood clots.  Patient is also reporting continued fatigue and shortness of breath and dyspnea, although this appears to be close to his baseline from discharge from hospital.  He is not hypoxic or in acute respiratory distress at this time.  Believe it is reasonable to obtain another chest x-ray to compare to his prior.  Also obtain an ultrasound of the lower extremity.  Clinical Course as of Apr 06 1705  Tue Apr 07, 2019  1000 No DVT reported on scan - xray with some improvement, patient informed of results and will discharge now   [MT]    Clinical Course User Index [MT] Deakon Frix, Kermit Balo, MD      Final Clinical Impressions(s) / ED Diagnoses   Final diagnoses:  Left leg pain  Shortness of breath  COVID-19    ED Discharge Orders    None       Terald Sleeper, MD 04/07/19 [phone]

## 2019-04-07 NOTE — Progress Notes (Signed)
Left lower extremity venous duplex has been completed. Preliminary results can be found in CV Proc through chart review.  Results were given to Dr. Langston Masker.  04/07/19 9:56 AM Frederick Williams RVT

## 2019-04-07 NOTE — ED Notes (Signed)
X-ray at bedside

## 2019-04-07 NOTE — ED Notes (Signed)
Pt left the department at this time without notifying staff he was leaving.

## 2019-04-07 NOTE — Discharge Instructions (Addendum)
Your ultrasound today of your left leg did not show signs of a blood clot.  Your x-ray also shows some improvement from your prior x-ray when you were admitted with COVID-19.  You should continue monitoring her respiratory symptoms at home.  You can always return to the emergency department if you feel like you are having difficulty breathing, lightheadedness, or chest pain, or any other acute medical concerns.

## 2019-04-07 NOTE — Telephone Encounter (Signed)
Patient wanted to let you know he went to ER and had chest xray and ultrasound of leg.  His employer, [employer], has sent over short term disability forms to be filled out.  Patient does not feel like he is ready to go back to work yet, needs a little more time to recover from Covid.

## 2019-04-07 NOTE — Telephone Encounter (Signed)
Ok great, glad it was negative.  We will complete short term disability paperwork.

## 2019-04-07 NOTE — ED Triage Notes (Addendum)
Patient reports seeing PCP yesterday for left leg pain from top to foot. PCP would like blood wok, [place], and xray to rule out blood clots and pneumonia.   5/10 leg pain.   Reports shob with exertion     A/Ox4 Ambulatory to room with no shob or distress.

## 2019-04-08 ENCOUNTER — Encounter: Payer: Self-pay | Admitting: Family Medicine

## 2019-04-08 ENCOUNTER — Telehealth: Payer: Self-pay | Admitting: Family Medicine

## 2019-04-08 NOTE — Telephone Encounter (Signed)
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  Will cc to him.

## 2019-04-08 NOTE — Telephone Encounter (Signed)
Patient  ER provider has him going back to work tomorrow. FMLA paper was sent yesterday from [place] at the [place] [employer]. He is not completley better from [place] he does not feel like he can sit in dusty room. Wanting to speak with nurse that fills out FMLA paper work. Also needs a note keeping him out till Monday until FMLA is ready had chest xrays done [facility] wants [facility] to look at them. Will send to her she is off today will also send to covering PCP and [facility].

## 2019-04-08 NOTE — Telephone Encounter (Signed)
Still wanting to speak with [facility]. Please call.

## 2019-04-08 NOTE — Telephone Encounter (Signed)
I am fine with giving him the note till Monday and then we can look at the [facility], the other thing that we could also consider is that if he does want Evelina Dun to be his provider then we can make that change, he is only seen me once for an acute visit, we will look at the [facility] when it comes in.

## 2019-04-08 NOTE — Telephone Encounter (Signed)
Discussed FMLA & STD ppw. Pt has been out since 03/16/19. Hospitalized 03/24/19 - 03/29/19.

## 2019-04-09 ENCOUNTER — Ambulatory Visit: Payer: Managed Care, Other (non HMO) | Admitting: Family Medicine

## 2019-04-09 ENCOUNTER — Encounter (INDEPENDENT_AMBULATORY_CARE_PROVIDER_SITE_OTHER): Payer: Self-pay

## 2019-04-09 ENCOUNTER — Telehealth: Payer: Self-pay | Admitting: Family Medicine

## 2019-04-10 ENCOUNTER — Encounter (INDEPENDENT_AMBULATORY_CARE_PROVIDER_SITE_OTHER): Payer: Self-pay

## 2019-04-11 ENCOUNTER — Encounter (INDEPENDENT_AMBULATORY_CARE_PROVIDER_SITE_OTHER): Payer: Self-pay

## 2019-04-15 NOTE — Telephone Encounter (Signed)
LMOVM FMLA ppw has been faxed to [facility], please let me know if you have anymore questions

## 2019-04-24 ENCOUNTER — Telehealth: Payer: Self-pay | Admitting: Family Medicine

## 2019-04-24 ENCOUNTER — Ambulatory Visit (INDEPENDENT_AMBULATORY_CARE_PROVIDER_SITE_OTHER): Payer: Managed Care, Other (non HMO) | Admitting: Family

## 2019-04-24 ENCOUNTER — Encounter: Payer: Self-pay | Admitting: Family

## 2019-04-24 DIAGNOSIS — U071 COVID-19: Secondary | ICD-10-CM

## 2019-04-24 DIAGNOSIS — J1289 Other viral pneumonia: Secondary | ICD-10-CM

## 2019-04-24 MED ORDER — DEXAMETHASONE 6 MG PO TABS: 6.0000 mg | ORAL_TABLET | Freq: Every day | ORAL | 0 refills | Status: DC

## 2019-04-24 NOTE — Telephone Encounter (Signed)
Yes this is fine. Thanks.

## 2019-04-24 NOTE — Progress Notes (Signed)
   Virtual Visit via telephone Note Due to COVID-19 pandemic this visit was conducted virtually. This visit type was conducted due to national recommendations for restrictions regarding the COVID-19 Pandemic (e.g. social distancing, sheltering in place) in an effort to limit this patient's exposure and mitigate transmission in our community. All issues noted in this document were discussed and addressed.  A physical exam was not performed with this format.  I connected with Frederick Williams on 04/24/19 at 12:20 pm by telephone and verified that I am speaking with the correct person using two identifiers. Frederick Williams is currently located at home and no one is currently with him during visit. The provider, Evelina Dun, FNP is located in their office at time of visit.  I discussed the limitations, risks, security and privacy concerns of performing an evaluation and management service by telephone and the availability of in person appointments. I also discussed with the patient that there may be a patient responsible charge related to this service. The patient expressed understanding and agreed to proceed.   History and Present Illness:  Cough This is a recurrent problem. The current episode started 1 to 4 weeks ago. The problem has been waxing and waning. The problem occurs every few minutes. The cough is non-productive.    PT calls the office today with continued weakness. He was diagnosed with COVID on 05/15/18 and was admitted to the ED on 03/1719 and discharged on 03/29/19. He had COVID pneumonia and was treated with dexamethasone and remdesivir. He states he has intermittent cough that is worse when he goes outside. He's O2 at home is 90-92.   Review of Systems  Respiratory: Positive for cough.      Observations/Objective: No SOB or distress   Assessment and Plan: 1. COVID-19 - dexamethasone (DECADRON) 6 MG tablet; Take 1 tablet (6 mg total) by mouth daily.  Dispense: 7 tablet; Refill:  0  2. Pneumonia due to COVID-19 virus - dexamethasone (DECADRON) 6 MG tablet; Take 1 tablet (6 mg total) by mouth daily.  Dispense: 7 tablet; Refill: 0  Will give another round of dexamethasone  Will extend short disability to 01/ 04/21 given continues SOB Rest Encouraged exercise  Call if symptoms worsen or do not improve    I discussed the assessment and treatment plan with the patient. The patient was provided an opportunity to ask questions and all were answered. The patient agreed with the plan and demonstrated an understanding of the instructions.   The patient was advised to call back or seek an in-person evaluation if the symptoms worsen or if the condition fails to improve as anticipated.  The above assessment and management plan was discussed with the patient. The patient verbalized understanding of and has agreed to the management plan. Patient is aware to call the clinic if symptoms persist or worsen. Patient is aware when to return to the clinic for a follow-up visit. Patient educated on when it is appropriate to go to the emergency department.   Time call ended:  12:49 pm   I provided 29 minutes of non-face-to-face time during this encounter.    Evelina Dun, FNP

## 2019-04-24 NOTE — Telephone Encounter (Signed)
Ok to do? Please advise

## 2019-04-24 NOTE — Telephone Encounter (Signed)
Need to know what date to start note on. NA when trying to contact pt

## 2019-05-06 NOTE — Telephone Encounter (Signed)
Refer to my chart message - this encounter will be closed.

## 2019-05-12 ENCOUNTER — Other Ambulatory Visit: Payer: Self-pay | Admitting: Family

## 2019-05-12 DIAGNOSIS — R0602 Shortness of breath: Secondary | ICD-10-CM

## 2019-05-13 ENCOUNTER — Ambulatory Visit (INDEPENDENT_AMBULATORY_CARE_PROVIDER_SITE_OTHER): Payer: 59 | Admitting: Pulmonary Disease

## 2019-05-13 ENCOUNTER — Encounter: Payer: Self-pay | Admitting: Pulmonary Disease

## 2019-05-13 ENCOUNTER — Other Ambulatory Visit: Payer: Self-pay

## 2019-05-13 VITALS — BP 132/80 | HR 88 | Temp 97.1°F | Ht 72.0 in | Wt 190.8 lb

## 2019-05-13 DIAGNOSIS — Z8616 Personal history of COVID-19: Secondary | ICD-10-CM | POA: Diagnosis not present

## 2019-05-13 DIAGNOSIS — Z87891 Personal history of nicotine dependence: Secondary | ICD-10-CM | POA: Diagnosis not present

## 2019-05-13 DIAGNOSIS — R0602 Shortness of breath: Secondary | ICD-10-CM

## 2019-05-13 DIAGNOSIS — B948 Sequelae of other specified infectious and parasitic diseases: Secondary | ICD-10-CM | POA: Diagnosis not present

## 2019-05-13 DIAGNOSIS — Z8619 Personal history of other infectious and parasitic diseases: Secondary | ICD-10-CM | POA: Diagnosis not present

## 2019-05-13 LAB — D-DIMER, QUANTITATIVE: D-Dimer, Quant: 0.49 mcg/mL FEU (ref <0.50)

## 2019-05-13 NOTE — Addendum Note (Signed)
Addended by: Velvet Bathe on: 05/13/2019 01:42 PM   Modules accepted: Orders

## 2019-05-13 NOTE — Patient Instructions (Signed)
Shortness of breath START Spiriva 2.5 mcg TWO puffs ONCE a day. This is your EVERYDAY inhaler CONTINUE Albuterol as needed for shortness of breath or wheezing We will arrange for pulmonary function tests to evaluate your lungs  History of COVID-19 pneumonia, shortness of breath and chest pain Will obtain labs today Will order CT scan  Follow-up after PFTs

## 2019-05-13 NOTE — Progress Notes (Signed)
Subjective:   PATIENT ID: Frederick Williams GENDER: male DOB: 04/15/1961, MRN: [id number]   HPI  Chief Complaint  Patient presents with  . Pulmonary Consult    Self referral for low O2 on exertion. Dx with covid 03/14/2019 and has had issues since. SOB on exertion. Just returned to work 05/12/2019. Nonproductive cough for the last few day. Pt denies any fever or chills.    Reason for Visit: New consult for shortness of breath  Frederick Williams is a 59 year old male active smoker with  history of COVID-19 pneumonia who presents as a new consult for persistent shortness of breath and prior history of COVID-19 pneumonia.  EMR was reviewed and summarized as follows. He was initially diagnosed with COVID-19 pneumonia on 03/16/2019 and required hospitalization from 11/17-11/22.  He was treated with IV dexamethasone and remdesivir.  At discharge he did not require home oxygen. Since then he has had persistent shortness of breath with exertion with saturations ranging from > 88-93%.  He did present to the ED on 04/07/2023 left lower leg pain however bilateral lower extremity ultrasounds were negative for DVT. His primary care doctor, Jannifer Rodney, FNP has prescribed a total of 14 days of dexamethasone for respiratory symptoms including cough.  He has also been on short-term disability due to his symptoms.  He has not worked since 03/14/19 due to symptoms of COVID-19. 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 He has dyspnea on exertion especially after longer activities including work and walking around the grocery store. He also feels short of breath while changing his clothes. Talking has causes mild bilateral chest discomfort L>R and makes him feel like he has to catch his breath. Denies nocturnal dyspnea. At home, he states he is not as active with the colder weather. He started work again last week but feels exhausted and noted that his O2 sats will drop to 88% after  completing strenuous activity. His job requires him to strap and load bricks and the manual labor exacerbates his symptoms.  Social History: Former smoker x 32 years 1-2ppd. Stopped smoking cigarettes but was vaping 7 years. Quit vaping this year.  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  Past Medical History:  Diagnosis Date  . Pneumonia due to COVID-19 virus      Family History  Problem Relation Age of Onset  . Dementia Mother   . Hypertension Sister      Social History   Occupational History  . Not on file  Tobacco Use  . Smoking status: Former Smoker    Packs/day: 2.00    Types: Cigarettes    Quit date: 2013    Years since quitting: 8.0  . Smokeless tobacco: Former Neurosurgeon    Quit date: 02/02/2011  Substance and Sexual Activity  . Alcohol use: Yes  . Drug use: Not Currently    Types: Marijuana  . Sexual activity: Not on file    Allergies  Allergen Reactions  . Ciprofloxacin     Tendon pain  . Prednisone Other (See Comments)    Severe abd cramping  . Penicillins Rash     Outpatient Medications Prior to Visit  Medication Sig Dispense Refill  . albuterol (VENTOLIN HFA) 108 (90 Base) MCG/ACT inhaler Inhale 1-2 puffs into the lungs every 2 (two) hours as needed for wheezing or shortness of breath. 18  g 1  . dexamethasone (DECADRON) 6 MG tablet Take 1 tablet (6 mg total) by mouth daily. (Patient not taking: Reported on 05/13/2019) 7 tablet 0  . pantoprazole (PROTONIX) 40 MG tablet Take 1 tablet (40 mg total) by mouth 2 (two) times daily. While taking dexamethasone (Patient not taking: Reported on 05/13/2019) 30 tablet 0  . vitamin C (VITAMIN C) 500 MG tablet Take 1 tablet (500 mg total) by mouth daily. (Patient not taking: Reported on 05/13/2019) 30 tablet 0   No facility-administered medications prior to visit.     Review of Systems  Constitutional: Negative for chills, diaphoresis, fever, malaise/fatigue and weight loss.  HENT: Negative for congestion, ear pain and sore throat.   Respiratory: Positive for cough and shortness of breath. Negative for hemoptysis, sputum production and wheezing.   Cardiovascular: Positive for chest pain. Negative for palpitations and leg swelling.  Gastrointestinal: Negative for abdominal pain, heartburn and nausea.  Genitourinary: Negative for frequency.  Musculoskeletal: Negative for joint pain and myalgias.  Skin: Negative for itching and rash.  Neurological: Negative for dizziness, weakness and headaches.  Endo/Heme/Allergies: Does not bruise/bleed easily.  Psychiatric/Behavioral: Negative for depression. The patient is not nervous/anxious.      Objective:   Vitals:   05/13/19 0933  BP: 132/80  Pulse: 88  Temp: (!) 97.1 F (36.2 C)  TempSrc: Temporal  SpO2: 97%  Weight: 190 lb 12.8 oz (86.5 kg)  Height: 6' (1.829 m)   SpO2: 97 %(on ra) O2 Device: None (Room air)  Physical Exam: General: Well-appearing, no acute distress HENT: , AT Eyes: EOMI, no scleral icterus Respiratory: Clear to auscultation bilaterally.  No crackles, wheezing or rales Cardiovascular: RRR, -M/R/G, no JVD GI: BS+, soft, nontender Extremities:-Edema,-tenderness Neuro: AAO x4, CNII-XII grossly intact Skin: Intact, no rashes or bruising Psych: Normal mood, normal affect  Data Reviewed:  Imaging: CXR 04/07/19 - Resolved bilateral opacities compared to CXRs in 03/2019  PFT: None on file  Labs: CBC    Component Value Date/Time   WBC 11.5 (H) 03/29/2019 0314   RBC 4.83 03/29/2019 0314   HGB 15.2 03/29/2019 0314   HGB 16.3 03/25/2017 1007   HCT 46.5 03/29/2019 0314   HCT 48.7 03/25/2017 1007   PLT 207 03/29/2019 0314   PLT 189 03/25/2017 1007   MCV 96.3 03/29/2019 0314   MCV 92 03/25/2017 1007   MCH 31.5 03/29/2019 0314   MCHC 32.7 03/29/2019 0314   RDW 11.6  03/29/2019 0314   RDW 13.1 03/25/2017 1007   LYMPHSABS 0.7 03/29/2019 0314   LYMPHSABS 1.4 03/25/2017 1007   MONOABS 0.5 03/29/2019 0314   EOSABS 0.0 03/29/2019 0314   EOSABS 0.2 03/25/2017 1007   BASOSABS 0.0 03/29/2019 0314   BASOSABS 0.0 03/25/2017 1007   BMET    Component Value Date/Time   NA 139 03/29/2019 0314   NA 140 03/25/2017 1007   K 5.2 (H) 03/29/2019 0314   CL 102 03/29/2019 0314   CO2 28 03/29/2019 0314   GLUCOSE 167 (H) 03/29/2019 0314   BUN 21 (H) 03/29/2019 0314   BUN 18 03/25/2017 1007   CREATININE 0.99 03/29/2019 0314   CALCIUM 8.7 (L) 03/29/2019 0314   GFRNONAA >60 03/29/2019 0314   GFRAA >60 03/29/2019 0314      Assessment & Plan:   Discussion: 59 year old male with significant tobacco/vaping history and prior COVID-19 pneumonia who presents with shortness of breath of unclear etiology. No desaturation with ambulation. Will start with initial pulmonary work-up to  rule out obstructive vs restrictive lung disease. Will also consider echocardiogram if testing returns negative. We spent extensive time discussing reasons and plans for testing. I addressed patient's questions and concerns to the best of my ability.  Shortness of breath START Spiriva 2.5 mcg TWO puffs ONCE a day. This is your EVERYDAY inhaler CONTINUE Albuterol as needed for shortness of breath or wheezing We will arrange for pulmonary function tests to evaluate your lungs  History of COVID-19 pneumonia, shortness of breath and chest pain Will obtain labs today.  After reviewing labs, will obtain high resolution CT Chest   Health Maintenance  There is no immunization history on file for this patient.  CT Lung Screen - discuss at next visit  Orders Placed This Encounter  Procedures  . D-Dimer, Quantitative    Standing Status:   Future    Number of Occurrences:   1    Standing Expiration Date:   05/12/2020  . Pulmonary function test    Standing Status:   Future    Standing Expiration  Date:   05/12/2020    Order Specific Question:   Where should this test be performed?    Answer:   [facility]    Order Specific Question:   Full PFT: includes the following: basic spirometry, spirometry pre & post bronchodilator, diffusion capacity (DLCO), lung volumes    Answer:   Full PFT  No orders of the defined types were placed in this encounter.   Return for after PFTs.  I have spent a total time of 60-minutes on the day of the appointment reviewing prior documentation, coordinating care and discussing medical diagnosis and plan with the patient/family. Imaging, labs and tests included in this note have been reviewed and interpreted independently by me.  Dafna Romo Mechele Collin, MD [facility] 05/13/2019 1:16 PM  Office Number [phone]

## 2019-05-14 ENCOUNTER — Telehealth: Payer: Self-pay | Admitting: Pulmonary Disease

## 2019-05-14 DIAGNOSIS — R0602 Shortness of breath: Secondary | ICD-10-CM

## 2019-05-14 DIAGNOSIS — Z8616 Personal history of COVID-19: Secondary | ICD-10-CM

## 2019-05-14 NOTE — Telephone Encounter (Signed)
From: Luciano Cutter, MD  Sent: 05/13/2019  1:24 PM EST  To: Sheran Luz, RN   Can you please order High resolution CT?     Pt aware of the need for the CT chest. Order placed. Nothing further needed at this time.

## 2019-05-21 ENCOUNTER — Ambulatory Visit (INDEPENDENT_AMBULATORY_CARE_PROVIDER_SITE_OTHER)
Admission: RE | Admit: 2019-05-21 | Discharge: 2019-05-21 | Disposition: A | Discharge status: Home / Self Care | Payer: 59 | Source: Ambulatory Visit | Attending: Pulmonary Disease | Admitting: Pulmonary Disease

## 2019-05-21 ENCOUNTER — Other Ambulatory Visit: Payer: Self-pay

## 2019-05-21 DIAGNOSIS — R0602 Shortness of breath: Secondary | ICD-10-CM | POA: Diagnosis not present

## 2019-05-21 DIAGNOSIS — J189 Pneumonia, unspecified organism: Secondary | ICD-10-CM

## 2019-05-21 DIAGNOSIS — Z8616 Personal history of COVID-19: Secondary | ICD-10-CM

## 2019-05-21 NOTE — Telephone Encounter (Signed)
mychart message received by pt. Dr. Everardo All, please advise on it for pt.

## 2019-05-22 ENCOUNTER — Telehealth: Payer: Self-pay | Admitting: Pulmonary Disease

## 2019-05-22 MED ORDER — SPIRIVA RESPIMAT 2.5 MCG/ACT IN AERS: 2.0000 | INHALATION_SPRAY | Freq: Every day | RESPIRATORY_TRACT | 3 refills | Status: DC

## 2019-05-22 NOTE — Telephone Encounter (Signed)
I called and spoke with the pt and notified of results of ct per [facility]  He verbalized understanding  Refilled spiriva per his request  Nothing further needed

## 2019-05-22 NOTE — Telephone Encounter (Signed)
Pt calling requesting to know the results of the CT which was performed 1/14.  mychart message sent by pt yesterday requesting to know the result and I had stated in there to pt that we would let him know the results once heard from Dr. Everardo All.  Called and spoke with pt to see how he has been feeling since last visit and pt said as long as he isn't doing anything strenuous, he is doing okay. Pt said when he does begin to hustle like what he has to do with his job, he will begin to have some problems having a burning sensation in his chest and issues with fatigue.  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 Pt said there is only one area at his job that he can think of where he would not have to drive a truck. Pt said with it being at the beginning of the year, he is using up a lot of his vacation days. Pt said other than sitting in the truck driving, there is a lot of other things that goes on with his job but pt said he really likes what he does at work.  Pt said he is going to go to work Monday 1/18 to see how he feels and to also see how long he will last at work.  Due to pt going to try to go back to work Monday and also concerns about the results of the CT which he saw on mychart, sending this encounter to [facility] who is APP of the day. Beth, please advise on this for pt.

## 2019-05-22 NOTE — Telephone Encounter (Signed)
CT chest showed resolving COVID-19 infection.  If symptoms persistent recommend repeat HRCT in 6 months.   Incidental findings of atherosclerosis and coronary artery disease - Needs to follow up with PCP regarding this.

## 2019-05-25 ENCOUNTER — Other Ambulatory Visit: Payer: Self-pay | Admitting: Family

## 2019-05-25 NOTE — Progress Notes (Signed)
Pt called and discussed results of CT chest. Keep follow up Pulmonologist.

## 2019-05-26 ENCOUNTER — Telehealth: Payer: Self-pay | Admitting: Pulmonary Disease

## 2019-05-26 NOTE — Telephone Encounter (Signed)
LMTCB x1 for pt.  

## 2019-05-26 NOTE — Telephone Encounter (Signed)
Spoke with pt. He has several questions regarding his CT. States that he doesn't feel like he was given all of the information regarding his CT. I have answered all of his questions to the best of my ability. I was on the phone with the pt for over 20 minutes. Nothing further was needed.

## 2019-05-27 ENCOUNTER — Ambulatory Visit: Payer: 59 | Admitting: Pulmonary Disease

## 2019-06-02 ENCOUNTER — Ambulatory Visit (INDEPENDENT_AMBULATORY_CARE_PROVIDER_SITE_OTHER): Payer: Managed Care, Other (non HMO) | Admitting: Family

## 2019-06-02 ENCOUNTER — Encounter: Payer: Self-pay | Admitting: Family

## 2019-06-02 DIAGNOSIS — R5383 Other fatigue: Secondary | ICD-10-CM | POA: Diagnosis not present

## 2019-06-02 DIAGNOSIS — R4189 Other symptoms and signs involving cognitive functions and awareness: Secondary | ICD-10-CM

## 2019-06-02 DIAGNOSIS — U071 COVID-19: Secondary | ICD-10-CM

## 2019-06-02 DIAGNOSIS — F488 Other specified nonpsychotic mental disorders: Secondary | ICD-10-CM

## 2019-06-02 DIAGNOSIS — R0602 Shortness of breath: Secondary | ICD-10-CM | POA: Diagnosis not present

## 2019-06-02 DIAGNOSIS — J1282 Pneumonia due to coronavirus disease 2019: Secondary | ICD-10-CM

## 2019-06-02 NOTE — Progress Notes (Signed)
Virtual Visit via telephone Note Due to COVID-19 pandemic this visit was conducted virtually. This visit type was conducted due to national recommendations for restrictions regarding the COVID-19 Pandemic (e.g. social distancing, sheltering in place) in an effort to limit this patient's exposure and mitigate transmission in our community. All issues noted in this document were discussed and addressed.  A physical exam was not performed with this format.  I connected with Frederick Williams on 06/02/19 at 8:30 AM by telephone and verified that I am speaking with the correct person using two identifiers. Frederick Williams is currently located at home and no one is currently with him  during visit. The provider, Jannifer Rodney, FNP is located in their office at time of visit.  I discussed the limitations, risks, security and privacy concerns of performing an evaluation and management service by telephone and the availability of in person appointments. I also discussed with the patient that there may be a patient responsible charge related to this service. The patient expressed understanding and agreed to proceed.   History and Present Illness:  PT calls the office today with continued SOB. He was diagnosed with COVID on 03/21/20. He was hospitalized and was diagnosed with COVID pneumonia. He saw a Pulmonologist on 05/13/19 and a CT scan, D-Dimer, Pulmonary Function test was ordered. His CT scan showed:   1. Patchy areas of ground-glass attenuation and mild septal thickening in the lungs bilaterally which likely reflects resolving COVID-19 infection. Whether any of this is true pulmonary fibrosis at this point is uncertain. Should the patient's symptoms persist and if there is persistent clinical concern for interstitial lung disease, a repeat high-resolution chest CT would be recommended in 6-12 months to assess for temporal changes in the appearance of the lung parenchyma. 2. Aortic atherosclerosis, in  addition to 2 vessel coronary artery disease. Please note that although the presence of coronary artery calcium documents the presence of coronary artery disease, the severity of this disease and any potential stenosis cannot be assessed on this non-gated CT examination. Assessment for potential risk factor modification, dietary therapy or pharmacologic therapy may be warranted, if clinically indicated.  He is frustrated that he continues to have SOB and not get back to his normal routines. He states he tried to go back to work, but having trouble because of the fatigue, brain fog,  and SOB. He has intermittent chest tightness, SOB with any exertion. He would like to have his paper completed to return to short term disability.   He continues Spiriva  daily.  Shortness of Breath This is a recurrent problem. The current episode started more than 1 month ago. The problem occurs intermittently. The problem has been waxing and waning. Associated symptoms include chest pain, sputum production and wheezing. Pertinent negatives include no ear pain, fever, rhinorrhea, syncope or vomiting. He has tried rest and OTC cough suppressants for the symptoms. The treatment provided mild relief.         Review of Systems  Constitutional: Negative for fever.  HENT: Negative for ear pain and rhinorrhea.   Respiratory: Positive for sputum production, shortness of breath and wheezing.   Cardiovascular: Positive for chest pain. Negative for syncope.  Gastrointestinal: Negative for vomiting.     Observations/Objective: No SOB or distress noted, no cough noted  Assessment and Plan: 1. COVID-19  2. Fatigue, unspecified type  3. SOB (shortness of breath)  4. Brain fog  5. Pneumonia due to COVID-19 virus    Keep Pulmonologist's  appt Rest Force fluids Continue Spiriva Work note written  Call if symptoms worsen or do not improve      I discussed the assessment and treatment plan with the  patient. The patient was provided an opportunity to ask questions and all were answered. The patient agreed with the plan and demonstrated an understanding of the instructions.   The patient was advised to call back or seek an in-person evaluation if the symptoms worsen or if the condition fails to improve as anticipated.  The above assessment and management plan was discussed with the patient. The patient verbalized understanding of and has agreed to the management plan. Patient is aware to call the clinic if symptoms persist or worsen. Patient is aware when to return to the clinic for a follow-up visit. Patient educated on when it is appropriate to go to the emergency department.   Time call ended:  8:57 AM  I provided 27 minutes of non-face-to-face time during this encounter.    Evelina Dun, FNP

## 2019-06-03 ENCOUNTER — Telehealth: Payer: Self-pay | Admitting: Pulmonary Disease

## 2019-06-03 DIAGNOSIS — R0602 Shortness of breath: Secondary | ICD-10-CM

## 2019-06-03 NOTE — Telephone Encounter (Signed)
Pulmonary Telephone Encounter  Discussed patient regarding his CT scan. My conveyed my concerns that this is likely a sequelae of his COVID-19 pneumonia and may be fibrosis based on his dyspnea and exertional hypoxemia (nadir 83%) which recovers in less than 5 min. He does not feel like bronchodilators are improving his symptoms.   Plan: Will need to wait until PFTs can be done. Will arrange for DUONEBS q6h as needed for shortness of breath  Mechele Collin, M.D. [facility] 06/03/2019 5:27 PM

## 2019-06-04 ENCOUNTER — Telehealth: Payer: Self-pay | Admitting: Family

## 2019-06-04 MED ORDER — IPRATROPIUM-ALBUTEROL 0.5-2.5 (3) MG/3ML IN SOLN: 3.0000 mL | Freq: Four times a day (QID) | RESPIRATORY_TRACT | 3 refills | Status: DC | PRN

## 2019-06-04 NOTE — Telephone Encounter (Signed)
Order placed

## 2019-06-04 NOTE — Telephone Encounter (Signed)
FYI

## 2019-06-05 ENCOUNTER — Telehealth: Payer: Self-pay | Admitting: Pulmonary Disease

## 2019-06-05 NOTE — Telephone Encounter (Signed)
Patient seen by Dr. Everardo All 05/13/19 for SOB, Hx of Covid 19. Prescription for ned solution issued 06/04/19. There was a referral placed on 06/04/19.  Called the patient to make him aware the order was placed. He did confirm he picked up the medication from [facility].  PCC's, if this has not been signed by Dr. Everardo All yet, please advise. Maybe app of the day will sign off on her behalf so he can get the machine. Thank you.

## 2019-06-05 NOTE — Telephone Encounter (Signed)
This has been signed, the order has been sent to [facility] this morning at 11:08.

## 2019-06-08 NOTE — Telephone Encounter (Signed)
lmtcb for pt to see if he received his neb machine.

## 2019-06-09 NOTE — Telephone Encounter (Signed)
Spoke with pt. States that he received a call from [employer] on Friday asking about his insurance. He should receive the machine this week. While speaking to the pt, he asked if his PFT and OV with Dr. Everardo All could be moved up fron 07/13/2019. There was a cancellation on 06/18/2019 at 1500 for a PFT, his OV with Everardo All has been moved up to 06/18/19 at 1600. COVID testing has been scheduled for 06/15/2019 at 0905. Nothing further was needed.

## 2019-06-11 ENCOUNTER — Telehealth: Payer: Self-pay | Admitting: Pulmonary Disease

## 2019-06-11 ENCOUNTER — Encounter: Payer: Self-pay | Admitting: Pulmonary Disease

## 2019-06-11 NOTE — Telephone Encounter (Signed)
Spoke with patient got information on who to fax the letter to and letter has been faxed. Nothing further needed at this time.

## 2019-06-11 NOTE — Telephone Encounter (Signed)
Pt returning a phone call about a letter for jury duty. [phone]

## 2019-06-15 ENCOUNTER — Other Ambulatory Visit (HOSPITAL_COMMUNITY)
Admission: RE | Admit: 2019-06-15 | Discharge: 2019-06-15 | Disposition: A | Discharge status: Home / Self Care | Payer: 59 | Source: Ambulatory Visit | Attending: Pulmonary Disease | Admitting: Pulmonary Disease

## 2019-06-15 DIAGNOSIS — Z20822 Contact with and (suspected) exposure to covid-19: Secondary | ICD-10-CM | POA: Insufficient documentation

## 2019-06-15 DIAGNOSIS — Z01812 Encounter for preprocedural laboratory examination: Secondary | ICD-10-CM | POA: Diagnosis present

## 2019-06-15 LAB — SARS CORONAVIRUS 2 (TAT 6-24 HRS): SARS Coronavirus 2: NEGATIVE

## 2019-06-18 ENCOUNTER — Encounter: Payer: Self-pay | Admitting: Pulmonary Disease

## 2019-06-18 ENCOUNTER — Encounter: Payer: Self-pay | Admitting: *Deleted

## 2019-06-18 ENCOUNTER — Ambulatory Visit (INDEPENDENT_AMBULATORY_CARE_PROVIDER_SITE_OTHER): Payer: 59 | Admitting: Pulmonary Disease

## 2019-06-18 ENCOUNTER — Ambulatory Visit (INDEPENDENT_AMBULATORY_CARE_PROVIDER_SITE_OTHER): Payer: 59

## 2019-06-18 ENCOUNTER — Other Ambulatory Visit: Payer: Self-pay

## 2019-06-18 VITALS — BP 134/76 | HR 102 | Temp 98.1°F | Ht 72.0 in | Wt 199.2 lb

## 2019-06-18 DIAGNOSIS — R0602 Shortness of breath: Secondary | ICD-10-CM

## 2019-06-18 DIAGNOSIS — Z8619 Personal history of other infectious and parasitic diseases: Secondary | ICD-10-CM

## 2019-06-18 DIAGNOSIS — Z8616 Personal history of COVID-19: Secondary | ICD-10-CM

## 2019-06-18 DIAGNOSIS — J8410 Pulmonary fibrosis, unspecified: Secondary | ICD-10-CM | POA: Diagnosis not present

## 2019-06-18 LAB — PULMONARY FUNCTION TEST
DL/VA % pred: 102 %
DL/VA: 4.36 ml/min/mmHg/L
DLCO unc % pred: 68 %
DLCO unc: 20.27 ml/min/mmHg
FEF 25-75 Post: 3.52 L/s
FEF 25-75 Pre: 3.55 L/s
FEF2575-%Change-Post: -1 %
FEF2575-%Pred-Post: 107 %
FEF2575-%Pred-Pre: 108 %
FEV1-%Change-Post: -1 %
FEV1-%Pred-Post: 71 %
FEV1-%Pred-Pre: 72 %
FEV1-Post: 2.8 L
FEV1-Pre: 2.84 L
FEV1FVC-%Change-Post: 2 %
FEV1FVC-%Pred-Pre: 112 %
FEV6-%Change-Post: -4 %
FEV6-%Pred-Post: 64 %
FEV6-%Pred-Pre: 67 %
FEV6-Post: 3.19 L
FEV6-Pre: 3.33 L
FEV6FVC-%Pred-Post: 104 %
FEV6FVC-%Pred-Pre: 104 %
FVC-%Change-Post: -4 %
FVC-%Pred-Post: 61 %
FVC-%Pred-Pre: 64 %
FVC-Post: 3.19 L
FVC-Pre: 3.33 L
Post FEV1/FVC ratio: 88 %
Post FEV6/FVC ratio: 100 %
Pre FEV1/FVC ratio: 85 %
Pre FEV6/FVC Ratio: 100 %
RV % pred: 89 %
RV: 2.07 L
TLC % pred: 73 %
TLC: 5.46 L

## 2019-06-18 MED ORDER — FLUTICASONE-SALMETEROL 232-14 MCG/ACT IN AEPB: 2.0000 | INHALATION_SPRAY | Freq: Two times a day (BID) | RESPIRATORY_TRACT | 6 refills | Status: DC

## 2019-06-18 MED ORDER — PREDNISONE 10 MG PO TABS: ORAL_TABLET | ORAL | 0 refills | Status: AC

## 2019-06-18 NOTE — Progress Notes (Signed)
PFT done today. 

## 2019-06-18 NOTE — Progress Notes (Signed)
Subjective:   PATIENT ID: Frederick Williams GENDER: male DOB: [birth year], MRN: [id number]   HPI  Chief Complaint  Patient presents with  . Follow-up    f/u PFT     Reason for Visit: Follow-up for shortness of breath  [name] is a 59 year old male active smoker with  history of COVID-19 pneumonia who presents for follow-up.  Synopsis: Initially diagnosed with COVID-19 pneumonia on 03/16/2019 and required hospitalization from 11/17-11/22.  He was treated with IV dexamethasone and remdesivir.  At discharge he did not require home oxygen. Has tried to return to work however severely limited in walking short distances. Unable to perform his duties. 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   Since our last visit, he tried Spiriva without any improvement. He continues to have significant dyspnea on exertion including with talking and when changing his clothes. With heavy exertion, he has seen his O2 sats drop to 85% with slow recovery. 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 He has attempted to return and unsuccessful due to his physical limitations.  Social History: Former smoker x 32 years 1-2ppd. Stopped smoking cigarettes but was vaping 7 years. Quit vaping this year.  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  Past Medical History:  Diagnosis Date  . Pneumonia due to COVID-19 virus      Family History  Problem Relation Age of Onset  . Dementia Mother   . Hypertension Sister      Social History   Occupational History  . Not on file  Tobacco Use  . Smoking status: Former Smoker    Packs/day: 2.00    Types: Cigarettes    Quit date: 2013    Years since quitting: 8.1  . Smokeless tobacco: Former Neurosurgeon    Quit date: 02/02/2011    Substance and Sexual Activity  . Alcohol use: Yes  . Drug use: Not Currently    Types: Marijuana  . Sexual activity: Not on file    Allergies  Allergen Reactions  . Ciprofloxacin     Tendon pain  . Prednisone Other (See Comments)    Severe abd cramping  . Penicillins Rash     Outpatient Medications Prior to Visit  Medication Sig Dispense Refill  . albuterol (VENTOLIN HFA) 108 (90 Base) MCG/ACT inhaler Inhale 1-2 puffs into the lungs every 2 (two) hours as needed for wheezing or shortness of breath. 18 g 1  . ipratropium-albuterol (DUONEB) 0.5-2.5 (3) MG/3ML SOLN Take 3 mLs by nebulization every 6 (six) hours as needed. 360 mL 3  . Tiotropium Bromide Monohydrate (SPIRIVA RESPIMAT) 2.5 MCG/ACT AERS Inhale 2 puffs into the lungs daily. 4 g 3   No facility-administered medications prior to visit.    Review of Systems  Constitutional: Negative for chills, diaphoresis, fever, malaise/fatigue and weight loss.  HENT: Negative for congestion.   Respiratory: Positive for cough and shortness of breath. Negative for hemoptysis, sputum production and wheezing.   Cardiovascular: Negative for chest pain, palpitations and leg swelling.    Objective:   Vitals:   06/18/19 1606  BP: 134/76  Pulse: (!) 102  Temp: 98.1 F (36.7 C)  TempSrc: Temporal  SpO2: 97%  Weight: 199 lb 3.2 oz (90.4 kg)  Height: 6' (1.829 m)     Physical  Exam: General: Well-appearing, no acute distress HENT: [place], AT Eyes: EOMI, no scleral icterus Respiratory: Diminished breath sounds bilaterally.  No crackles, wheezing or rales Cardiovascular: RRR, -M/R/G, no JVD GI: BS+, soft, nontender Extremities:-Edema,-tenderness Neuro: AAO x4, CNII-XII grossly intact Skin: Intact, no rashes or bruising Psych: Normal mood, normal affect  Data Reviewed:  Imaging: CXR 04/07/19 - Resolved bilateral opacities compared to CXRs in 03/2019 CT Chest 114/21 - Mild bilateral patchy GGO   PFT: 06/18/19 FVC 3.19 (61%) FEV1  2.8 (71%) Ratio 85  TLC 73% DLCO 68% Interpretation: Mild restrictive defect with reduced gas exchange    Assessment & Plan:   Discussion: 59 year old male with significant tobacco/vaping history and prior COVID-19 pneumonia who presents for follow-up. Reports desaturation to 85% on heavy exertion however unable to capture this data in-office on ambulatory O2 check. We reviewed CT Chest which demonstrates fibrosis and PFTs which confirm restrictive defect with impaired gas exchange. Work note provided per patient request.  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 Take with meals --Take omeprazole 20 mg daily while you take the steroids. This medication can be purchased over the counter --Arrange for 6 minute walk test --Schedule HRCT for April 2021  Will follow-up with me in April  Health Maintenance  There is no immunization history on file for this patient.  CT Lung Screen - discuss at next visit  Orders Placed This Encounter  Procedures  . DG Chest 2 View    Standing Status:   Future    Number of Occurrences:   1    Standing Expiration Date:   08/15/2020    Order Specific Question:   Reason for Exam (SYMPTOM  OR DIAGNOSIS REQUIRED)    Answer:   SOB    Order Specific Question:   Preferred imaging location?    Answer:   Internal    Order Specific Question:   Radiology Contrast Protocol - do NOT remove file path    Answer:   \\[email or web address]\Radiant\DXFluoroContrastProtocols.pdf  . CT Chest High Resolution    Schedule after April 14th    Standing Status:   Future    Standing Expiration Date:   08/15/2020    Order Specific Question:   ** REASON FOR EXAM (FREE TEXT)    Answer:   lung fibrosis    Order Specific Question:   Preferred imaging location?    Answer:   [address]    Order Specific Question:   Radiology Contrast Protocol - do  NOT remove file path    Answer:   \\[email or web address]\Radiant\CTProtocols.pdf  . 6 minute walk    Standing Status:   Future    Standing Expiration Date:   06/17/2020    Order Specific Question:   Where should this test be performed?    Answer:   other   Meds ordered this encounter  Medications  . predniSONE (DELTASONE) 10 MG tablet    Sig: Take 3 tablets (30 mg total) by mouth daily with breakfast for 3 days, THEN 2 tablets (20 mg total) daily with breakfast for 3 days, THEN 1 tablet (10 mg total) daily with breakfast for 3 days.    Dispense:  18 tablet    Refill:  0  . Fluticasone-Salmeterol 232-14 MCG/ACT AEPB    Sig: Inhale 2 puffs into the lungs in the morning and at  bedtime.    Dispense:  1 each    Refill:  6    No follow-ups on file.  I have spent a total time of 40-minutes on the day of the appointment reviewing prior documentation, coordinating care and discussing medical diagnosis and plan with the patient/family. Imaging, labs and tests included in this note have been reviewed and interpreted independently by me.  Hali Balgobin Mechele Collin, MD [facility] 06/18/2019 2:14 PM  Office Number [phone]

## 2019-06-18 NOTE — Patient Instructions (Addendum)
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 Take with meals --Take omeprazole 20 mg daily while you take the steroids. This medication can be purchased over the counter --Arrange for 6 minute walk test --Schedule HRCT for April 2021  Will follow-up with me in April

## 2019-06-19 ENCOUNTER — Telehealth: Payer: Self-pay | Admitting: Pulmonary Disease

## 2019-06-19 DIAGNOSIS — J8410 Pulmonary fibrosis, unspecified: Secondary | ICD-10-CM

## 2019-06-19 NOTE — Telephone Encounter (Signed)
Spoke with the pt  He is asking about having STD forms completed   I advised to contact [employer] regarding this  He verbalized understanding  He states currently unable to write down their number and will call back for this information     When he calls back please give number to [employer] [phone]- THIS IS ALL THAT IS NEEDED thanks

## 2019-06-19 NOTE — Telephone Encounter (Signed)
Pt is leaving and will need to change his call back # - please call at [phone]

## 2019-06-19 NOTE — Progress Notes (Signed)
Spoke with pt and notified of results per Dr. Ellison. Pt verbalized understanding and denied any questions. 

## 2019-06-20 DIAGNOSIS — J441 Chronic obstructive pulmonary disease with (acute) exacerbation: Secondary | ICD-10-CM | POA: Insufficient documentation

## 2019-06-22 NOTE — Telephone Encounter (Signed)
Spoke with Frederick Williams, provided him the number for [employer] listed below for his disability forms.  Pt is concerned, states that Dr. Everardo All had talked about him being out of work X1 month, but his next appt isn't until April after his next CT and 22mw.  Pt would like clarification from Dr. Everardo All about his timeline back to work.  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  Dr. Everardo All please advise.  Thanks!

## 2019-06-22 NOTE — Telephone Encounter (Signed)
Spoke with pt, aware of recs.  Appt rescheduled to 3/9.  Referral to pulm rehab placed.  Nothing further needed at this time- will close encounter.

## 2019-06-22 NOTE — Telephone Encounter (Signed)
Patient states has questions about short term disability.  Patient phone number is [phone].

## 2019-06-22 NOTE — Telephone Encounter (Signed)
Please arrange for sooner appointment with in two weeks with me in early March. Will refer Pulmonary Rehab  We would discuss response to bronchodilator, steroids, rehab at next visit  Mechele Collin, M.D. [facility] 06/22/2019 11:09 AM

## 2019-06-23 ENCOUNTER — Encounter (HOSPITAL_COMMUNITY): Payer: Self-pay | Admitting: *Deleted

## 2019-06-23 ENCOUNTER — Telehealth (HOSPITAL_COMMUNITY): Payer: Self-pay

## 2019-06-23 NOTE — Progress Notes (Signed)
Received referral from Dr. Everardo All for this pt to participate in pulmonary rehab with the the diagnosis of Lung Fibrosis. Called and spoke with pt to determine preference in location. Pt lives in [place].  Pt prefers to participate at [facility].  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  Pt diagnosed with Covid 19 Pneumonia and was hospitalized at [facility] from 11/17-/ Clinical review of pt follow up appt on 2/11. Pulmonary office note.  Pt with Covid Risk Score - 1. Pt appropriate for scheduling for Pulmonary rehab.  Will forward to support staff for scheduling and verification of insurance eligibility/benefits with pt consent. Alanson Aly, BSN Cardiac and Emergency planning/management officer

## 2019-06-23 NOTE — Telephone Encounter (Signed)
Pt insurance is active and benefits verified through [employer] Co-pay 0, DED $3,000/$839.15 met, out of pocket 0/0 met, co-insurance 0. no pre-authorization required, REF# [id number]

## 2019-06-24 ENCOUNTER — Telehealth (HOSPITAL_COMMUNITY): Payer: Self-pay

## 2019-06-29 ENCOUNTER — Encounter (HOSPITAL_COMMUNITY)
Admission: RE | Admit: 2019-06-29 | Discharge: 2019-06-29 | Disposition: A | Discharge status: Home / Self Care | Payer: 59 | Source: Ambulatory Visit | Attending: Pulmonary Disease | Admitting: Pulmonary Disease

## 2019-06-29 ENCOUNTER — Other Ambulatory Visit: Payer: Self-pay

## 2019-06-29 VITALS — BP 130/80 | Ht 72.0 in | Wt 191.6 lb

## 2019-06-29 DIAGNOSIS — J8410 Pulmonary fibrosis, unspecified: Secondary | ICD-10-CM | POA: Insufficient documentation

## 2019-06-29 DIAGNOSIS — Z8616 Personal history of COVID-19: Secondary | ICD-10-CM | POA: Diagnosis not present

## 2019-06-29 NOTE — Progress Notes (Signed)
Frederick Williams 59 y.o. male Pulmonary Rehab Orientation Note Patient arrived today in Cardiac and Pulmonary Rehab for orientation to Pulmonary Rehab. He walked from the [facility] parking deck with moderate shortness of breath, but tolerated it well. He does not carry portable oxygen. Per pt, he uses oxygen never. Color good, skin warm and dry. Patient is oriented to time and place. Patient's medical history, psychosocial health, and medications reviewed. Psychosocial assessment reveals pt lives with their spouse. 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 He c/o "brain fog" and becomes very short of breath when performing any strenuous activities.  He and Dr. 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  He was hospitalized for 5 days and received remdesivir.   Pt reports his stress level is low. Areas of stress/anxiety include Health.  Pt does not exhibit  signs of depression. PHQ2/9 score 0/0. Pt shows good  coping skills with not done outlook .  Will continue to monitor and evaluate progress toward psychosocial goal(s) of no barriers or psychosocial concerns while in pulmonary rehab.Marland Kitchen Physical assessment reveals heart rate is normal, breath sounds clear to auscultation, no wheezes, rales, or rhonchi. Grip strength equal, strong. Patient reports he does take medications as prescribed. Patient states he follows a Regular diet. The patient reports no specific efforts to gain or lose weight.. Patient's weight will be monitored closely. Demonstration and practice of PLB using pulse oximeter. Patient able to return demonstration satisfactorily. Safety and hand hygiene in the  exercise area reviewed with patient. Patient voices understanding of the information reviewed. Department expectations discussed with patient and achievable goals were set. The patient shows enthusiasm about attending the program and we look forward to working with this nice gentleman. The patient completed a 6 min walk test today and to begin exercise on 07/07/2019 in the 1015 class.  [id number]

## 2019-06-30 NOTE — Progress Notes (Signed)
Pulmonary Individual Treatment Plan  Patient Details  Name: Frederick Williams MRN: [id number] Date of Birth: 1960/08/24 Referring Provider:     Pulmonary Rehab Walk Test from 06/29/2019 in [facility] CARDIAC [facility]  Referring Provider  Dr. Everardo All      Initial Encounter Date:    Pulmonary Rehab Walk Test from 06/29/2019 in [facility] CARDI[facility] REHAB  Date  06/29/19      Visit Diagnosis: Lung fibrosis (HCC)  Patient's Home Medications on Admission:   Current Outpatient Medications:  .  albuterol (VENTOLIN HFA) 108 (90 Base) MCG/ACT inhaler, Inhale 1-2 puffs into the lungs every 2 (two) hours as needed for wheezing or shortness of breath., Disp: 18 g, Rfl: 1 .  Fluticasone-Salmeterol 232-14 MCG/ACT AEPB, Inhale 2 puffs into the lungs in the morning and at bedtime., Disp: 1 each, Rfl: 6 .  ipratropium-albuterol (DUONEB) 0.5-2.5 (3) MG/3ML SOLN, Take 3 mLs by nebulization every 6 (six) hours as needed., Disp: 360 mL, Rfl: 3 .  Tiotropium Bromide Monohydrate (SPIRIVA RESPIMAT) 2.5 MCG/ACT AERS, Inhale 2 puffs into the lungs daily. (Patient not taking: Reported on 06/18/2019), Disp: 4 g, Rfl: 3  Past Medical History: Past Medical History:  Diagnosis Date  . Pneumonia due to COVID-19 virus     Tobacco Use: Social History   Tobacco Use  Smoking Status Former Smoker  . Packs/day: 2.00  . Years: 32.00  . Pack years: 64.00  . Types: Cigarettes  . Quit date: 2013  . Years since quitting: 8.1  Smokeless Tobacco Former Neurosurgeon  . Quit date: 02/02/2011    Labs: Recent Review Flowsheet Data    Labs for ITP Cardiac and Pulmonary Rehab Latest Ref Rng & Units 03/24/2019   Trlycerides <150 mg/dL 878      Capillary Blood Glucose: Lab Results  Component Value Date   GLUCAP 163 (H) 03/28/2019     Pulmonary Assessment Scores: Pulmonary Assessment Scores    Row Name 06/29/19 1403         ADL UCSD   ADL Phase  Entry     SOB Score total  16       CAT  Score   CAT Score  16       UCSD: Self-administered rating of dyspnea associated with activities of daily living (ADLs) 6-point scale (0 = "not at all" to 5 = "maximal or unable to do because of breathlessness")  Scoring Scores range from 0 to 120.  Minimally important difference is 5 units  CAT: CAT can identify the health impairment of COPD patients and is better correlated with disease progression.  CAT has a scoring range of zero to 40. The CAT score is classified into four groups of low (less than 10), medium (10 - 20), high (21-30) and very high (31-40) based on the impact level of disease on health status. A CAT score over 10 suggests significant symptoms.  A worsening CAT score could be explained by an exacerbation, poor medication adherence, poor inhaler technique, or progression of COPD or comorbid conditions.  CAT MCID is 2 points  mMRC: mMRC (Modified Medical Research Council) Dyspnea Scale is used to assess the degree of baseline functional disability in patients of respiratory disease due to dyspnea. No minimal important difference is established. A decrease in score of 1 point or greater is considered a positive change.   Pulmonary Function Assessment:   Exercise Target Goals: Exercise Program Goal: Individual exercise prescription set using results from initial 6 min  walk test and THRR while considering  patient's activity barriers and safety.   Exercise Prescription Goal: Initial exercise prescription builds to 30-45 minutes a day of aerobic activity, 2-3 days per week.  Home exercise guidelines will be given to patient during program as part of exercise prescription that the participant will acknowledge.  Activity Barriers & Risk Stratification: Activity Barriers & Cardiac Risk Stratification - 06/29/19 1235      Activity Barriers & Cardiac Risk Stratification   Activity Barriers  Back Problems;Deconditioning;Muscular Weakness;Shortness of Breath       6 Minute  Walk: 6 Minute Walk    Row Name 06/29/19 1328         6 Minute Walk   Phase  Initial     Distance  1470 feet     Walk Time  6 minutes     # of Rest Breaks  0     MPH  2.78     METS  4.48     RPE  12     Perceived Dyspnea   3     VO2 Peak  15.69     Symptoms  No     Resting HR  98 bpm     Resting BP  128/84     Resting Oxygen Saturation   95 %     Exercise Oxygen Saturation  during 6 min walk  94 %     Max Ex. HR  126 bpm     Max Ex. BP  152/80     2 Minute Post BP  124/86       Interval HR   1 Minute HR  108     2 Minute HR  111     3 Minute HR  120     4 Minute HR  126     5 Minute HR  107     6 Minute HR  108     2 Minute Post HR  96     Interval Heart Rate?  Yes       Interval Oxygen   Interval Oxygen?  Yes     Baseline Oxygen Saturation %  95 %     1 Minute Oxygen Saturation %  95 %     1 Minute Liters of Oxygen  0 L     2 Minute Oxygen Saturation %  94 %     2 Minute Liters of Oxygen  0 L     3 Minute Oxygen Saturation %  95 %     3 Minute Liters of Oxygen  0 L     4 Minute Oxygen Saturation %  95 %     4 Minute Liters of Oxygen  0 L     5 Minute Oxygen Saturation %  95 %     5 Minute Liters of Oxygen  0 L     6 Minute Oxygen Saturation %  96 %     6 Minute Liters of Oxygen  0 L     2 Minute Post Oxygen Saturation %  95 %     2 Minute Post Liters of Oxygen  0 L        Oxygen Initial Assessment: Oxygen Initial Assessment - 06/29/19 1328      Home Oxygen   Home Oxygen Device  None    Sleep Oxygen Prescription  None    Home Exercise Oxygen Prescription  None    Home at Rest Exercise Oxygen Prescription  None    Compliance with Home Oxygen Use  Yes      Initial 6 min Walk   Oxygen Used  None      Program Oxygen Prescription   Program Oxygen Prescription  None      Intervention   Short Term Goals  To learn and exhibit compliance with exercise, home and travel O2 prescription;To learn and understand importance of monitoring SPO2 with pulse  oximeter and demonstrate accurate use of the pulse oximeter.;To learn and understand importance of maintaining oxygen saturations>88%;To learn and demonstrate proper pursed lip breathing techniques or other breathing techniques.;To learn and demonstrate proper use of respiratory medications    Long  Term Goals  Exhibits compliance with exercise, home and travel O2 prescription;Verbalizes importance of monitoring SPO2 with pulse oximeter and return demonstration;Maintenance of O2 saturations>88%;Exhibits proper breathing techniques, such as pursed lip breathing or other method taught during program session;Compliance with respiratory medication;Demonstrates proper use of MDI's       Oxygen Re-Evaluation:   Oxygen Discharge (Final Oxygen Re-Evaluation):   Initial Exercise Prescription: Initial Exercise Prescription - 06/29/19 1300      Date of Initial Exercise RX and Referring Provider   Date  06/29/19    Referring Provider  Dr. Everardo All      Treadmill   MPH  2    Grade  1    Minutes  15      Bike   Level  2    Minutes  15      Prescription Details   Frequency (times per week)  2    Duration  Progress to 30 minutes of continuous aerobic without signs/symptoms of physical distress      Intensity   THRR 40-80% of Max Heartrate  65-130    Ratings of Perceived Exertion  11-13    Perceived Dyspnea  0-4      Progression   Progression  Continue to progress workloads to maintain intensity without signs/symptoms of physical distress.      Resistance Training   Training Prescription  Yes    Weight  blue bands    Reps  10-15       Perform Capillary Blood Glucose checks as needed.  Exercise Prescription Changes:   Exercise Comments:   Exercise Goals and Review: Exercise Goals    Row Name 06/29/19 1333             Exercise Goals   Increase Physical Activity  Yes       Intervention  Provide advice, education, support and counseling about physical activity/exercise  needs.;Develop an individualized exercise prescription for aerobic and resistive training based on initial evaluation findings, risk stratification, comorbidities and participant's personal goals.       Expected Outcomes  Short Term: Attend rehab on a regular basis to increase amount of physical activity.;Long Term: Add in home exercise to make exercise part of routine and to increase amount of physical activity.;Long Term: Exercising regularly at least 3-5 days a week.       Increase Strength and Stamina  Yes       Intervention  Provide advice, education, support and counseling about physical activity/exercise needs.;Develop an individualized exercise prescription for aerobic and resistive training based on initial evaluation findings, risk stratification, comorbidities and participant's personal goals.       Expected Outcomes  Short Term: Increase workloads from initial exercise prescription for resistance, speed, and METs.;Short Term: Perform resistance training exercises routinely during rehab and add in resistance training  at home;Long Term: Improve cardiorespiratory fitness, muscular endurance and strength as measured by increased METs and functional capacity ( )       Able to understand and use rate of perceived exertion (RPE) scale  Yes       Intervention  Provide education and explanation on how to use RPE scale       Expected Outcomes  Short Term: Able to use RPE daily in rehab to express subjective intensity level;Long Term:  Able to use RPE to guide intensity level when exercising independently       Able to understand and use Dyspnea scale  Yes       Intervention  Provide education and explanation on how to use Dyspnea scale       Expected Outcomes  Short Term: Able to use Dyspnea scale daily in rehab to express subjective sense of shortness of breath during exertion;Long Term: Able to use Dyspnea scale to guide intensity level when exercising independently       Knowledge and  understanding of Target Heart Rate Range (THRR)  Yes       Intervention  Provide education and explanation of THRR including how the numbers were predicted and where they are located for reference       Expected Outcomes  Short Term: Able to state/look up THRR;Short Term: Able to use daily as guideline for intensity in rehab;Long Term: Able to use THRR to govern intensity when exercising independently       Understanding of Exercise Prescription  Yes       Intervention  Provide education, explanation, and written materials on patient's individual exercise prescription       Expected Outcomes  Short Term: Able to explain program exercise prescription;Long Term: Able to explain home exercise prescription to exercise independently          Exercise Goals Re-Evaluation :   Discharge Exercise Prescription (Final Exercise Prescription Changes):   Nutrition:  Target Goals: Understanding of nutrition guidelines, daily intake of sodium 1500mg , cholesterol 200mg , calories 30% from fat and 7% or less from saturated fats, daily to have 5 or more servings of fruits and vegetables.  Biometrics: Pre Biometrics - 06/29/19 1307      Pre Biometrics   Height  6' (1.829 m)    Weight  86.9 kg    BMI (Calculated)  25.98    Grip Strength  40 kg        Nutrition Therapy Plan and Nutrition Goals:   Nutrition Assessments:   Nutrition Goals Re-Evaluation:   Nutrition Goals Discharge (Final Nutrition Goals Re-Evaluation):   Psychosocial: Target Goals: Acknowledge presence or absence of significant depression and/or stress, maximize coping skills, provide positive support system. Participant is able to verbalize types and ability to use techniques and skills needed for reducing stress and depression.  Initial Review & Psychosocial Screening: Initial Psych Review & Screening - 06/29/19 1240      Initial Review   Current issues with  None Identified      Family Dynamics   Good Support System?   Yes      Barriers   Psychosocial barriers to participate in program  There are no identifiable barriers or psychosocial needs.      Screening Interventions   Interventions  Encouraged to exercise       Quality of Life Scores:  Scores of 19 and below usually indicate a poorer quality of life in these areas.  A difference of  2-3 points is a clinically  meaningful difference.  A difference of 2-3 points in the total score of the Quality of Life Index has been associated with significant improvement in overall quality of life, self-image, physical symptoms, and general health in studies assessing change in quality of life.  PHQ-9: Recent Review Flowsheet Data    Depression screen [facility] 2/9 06/29/2019 04/10/2018 04/05/2017 04/02/2017 03/25/2017   Decreased Interest 0 0 0 0 0   Down, Depressed, Hopeless 0 0 0 0 0   PHQ - 2 Score 0 0 0 0 0   Altered sleeping 0 - - - -   Tired, decreased energy 0 - - - -   Change in appetite 0 - - - -   Feeling bad or failure about yourself  0 - - - -   Trouble concentrating 0 - - - -   Moving slowly or fidgety/restless 0 - - - -   Suicidal thoughts 0 - - - -   Difficult doing work/chores Not difficult at all - - - -     Interpretation of Total Score  Total Score Depression Severity:  1-4 = Minimal depression, 5-9 = Mild depression, 10-14 = Moderate depression, 15-19 = Moderately severe depression, 20-27 = Severe depression   Psychosocial Evaluation and Intervention: Psychosocial Evaluation - 06/29/19 1242      Psychosocial Evaluation & Interventions   Interventions  Encouraged to exercise with the program and follow exercise prescription    Comments  No barriers to participation in pulmonary rehab    Expected Outcomes  Will continue to have no barriers to participation in pulmonary rehab.    Continue Psychosocial Services   No Follow up required       Psychosocial Re-Evaluation:   Psychosocial Discharge (Final Psychosocial  Re-Evaluation):   Education: Education Goals: Education classes will be provided on a weekly basis, covering required topics. Participant will state understanding/return demonstration of topics presented.  Learning Barriers/Preferences: Learning Barriers/Preferences - 06/29/19 1245      Learning Barriers/Preferences   Learning Barriers  None    Learning Preferences  Audio;Computer/Internet;Group Instruction;Individual Instruction;Pictoral;Skilled Demonstration;Verbal Instruction;Video;Written Material       Education Topics: Risk Factor Reduction:  -Group instruction that is supported by a PowerPoint presentation. Instructor discusses the definition of a risk factor, different risk factors for pulmonary disease, and how the heart and lungs work together.     Nutrition for Pulmonary Patient:  -Group instruction provided by PowerPoint slides, verbal discussion, and written materials to support subject matter. The instructor gives an explanation and review of healthy diet recommendations, which includes a discussion on weight management, recommendations for fruit and vegetable consumption, as well as protein, fluid, caffeine, fiber, sodium, sugar, and alcohol. Tips for eating when patients are short of breath are discussed.   Pursed Lip Breathing:  -Group instruction that is supported by demonstration and informational handouts. Instructor discusses the benefits of pursed lip and diaphragmatic breathing and detailed demonstration on how to preform both.     Oxygen Safety:  -Group instruction provided by PowerPoint, verbal discussion, and written material to support subject matter. There is an overview of "What is Oxygen" and "Why do we need it".  Instructor also reviews how to create a safe environment for oxygen use, the importance of using oxygen as prescribed, and the risks of noncompliance. There is a brief discussion on traveling with oxygen and resources the patient may  utilize.   Oxygen Equipment:  -Group instruction provided by [facility] Staff utilizing handouts, written materials, and  equipment demonstrations.   Signs and Symptoms:  -Group instruction provided by written material and verbal discussion to support subject matter. Warning signs and symptoms of infection, stroke, and heart attack are reviewed and when to call the physician/911 reinforced. Tips for preventing the spread of infection discussed.   Advanced Directives:  -Group instruction provided by verbal instruction and written material to support subject matter. Instructor reviews Advanced Directive laws and proper instruction for filling out document.   Pulmonary Video:  -Group video education that reviews the importance of medication and oxygen compliance, exercise, good nutrition, pulmonary hygiene, and pursed lip and diaphragmatic breathing for the pulmonary patient.   Exercise for the Pulmonary Patient:  -Group instruction that is supported by a PowerPoint presentation. Instructor discusses benefits of exercise, core components of exercise, frequency, duration, and intensity of an exercise routine, importance of utilizing pulse oximetry during exercise, safety while exercising, and options of places to exercise outside of rehab.     Pulmonary Medications:  -Verbally interactive group education provided by instructor with focus on inhaled medications and proper administration.   Anatomy and Physiology of the Respiratory System and Intimacy:  -Group instruction provided by PowerPoint, verbal discussion, and written material to support subject matter. Instructor reviews respiratory cycle and anatomical components of the respiratory system and their functions. Instructor also reviews differences in obstructive and restrictive respiratory diseases with examples of each. Intimacy, Sex, and Sexuality differences are reviewed with a discussion on how relationships can change when diagnosed  with pulmonary disease. Common sexual concerns are reviewed.   MD DAY -A group question and answer session with a medical doctor that allows participants to ask questions that relate to their pulmonary disease state.   OTHER EDUCATION -Group or individual verbal, written, or video instructions that support the educational goals of the pulmonary rehab program.   Holiday Eating Survival Tips:  -Group instruction provided by PowerPoint slides, verbal discussion, and written materials to support subject matter. The instructor gives patients tips, tricks, and techniques to help them not only survive but enjoy the holidays despite the onslaught of food that accompanies the holidays.   Knowledge Questionnaire Score: Knowledge Questionnaire Score - 06/29/19 1404      Knowledge Questionnaire Score   Pre Score  17/18       Core Components/Risk Factors/Patient Goals at Admission: Personal Goals and Risk Factors at Admission - 06/29/19 1245      Core Components/Risk Factors/Patient Goals on Admission   Improve shortness of breath with ADL's  Yes    Intervention  Provide education, individualized exercise plan and daily activity instruction to help decrease symptoms of SOB with activities of daily living.    Expected Outcomes  Short Term: Improve cardiorespiratory fitness to achieve a reduction of symptoms when performing ADLs;Long Term: Be able to perform more ADLs without symptoms or delay the onset of symptoms       Core Components/Risk Factors/Patient Goals Review:  Goals and Risk Factor Review    Row Name 06/29/19 1245             Core Components/Risk Factors/Patient Goals Review   Personal Goals Review  Develop more efficient breathing techniques such as purse lipped breathing and diaphragmatic breathing and practicing self-pacing with activity.;Increase knowledge of respiratory medications and ability to use respiratory devices properly.;Improve shortness of breath with ADL's           Core Components/Risk Factors/Patient Goals at Discharge (Final Review):  Goals and Risk Factor Review - 06/29/19 1245  Core Components/Risk Factors/Patient Goals Review   Personal Goals Review  Develop more efficient breathing techniques such as purse lipped breathing and diaphragmatic breathing and practicing self-pacing with activity.;Increase knowledge of respiratory medications and ability to use respiratory devices properly.;Improve shortness of breath with ADL's       ITP Comments:   Comments:

## 2019-07-01 ENCOUNTER — Telehealth: Payer: Self-pay | Admitting: Pulmonary Disease

## 2019-07-01 NOTE — Telephone Encounter (Signed)
Called and spoke to Frederick Williams. 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 He states his leave ends tomorrow and if he were to return to work he would still be surrounded by brick dust and feels this would cause trouble with his breathing. Frederick Williams is requesting these forms be filled out asap. Dr. Everardo All is currently in the hospital this week and doesn't return to clinic until 3/8 and Frederick Williams is needing the forms returned before then.    Lauren/Chan, please advise on the location of these forms and if possible could they get couried over to JE if she doesn't already have them? Thanks.

## 2019-07-01 NOTE — Telephone Encounter (Addendum)
Pt following up on this call.  States he expected a phone call before now.   Pt states this needs to be faxed [phone] for [facility].

## 2019-07-02 NOTE — Telephone Encounter (Signed)
We have the paperwork here I have had it on hold since she is not in the office [phone]

## 2019-07-02 NOTE — Telephone Encounter (Signed)
Called and spoke to pt. Informed pt that we have the FMLA forms but JE is not able to complete them this week but perhaps next week. I will speak with Lauren in hopes Dr. Everardo All can complete next week. Pt is requesting to have the forms completed to reflect the new FMLA to begin on 07/03/2019 as his current one ends today 07/02/19.   Will forward to Lauren to see if JE can complete these early next week.

## 2019-07-07 ENCOUNTER — Encounter (HOSPITAL_COMMUNITY)
Admission: RE | Admit: 2019-07-07 | Discharge: 2019-07-07 | Disposition: A | Discharge status: Home / Self Care | Payer: 59 | Source: Ambulatory Visit | Attending: Pulmonary Disease | Admitting: Pulmonary Disease

## 2019-07-07 ENCOUNTER — Other Ambulatory Visit: Payer: Self-pay

## 2019-07-07 DIAGNOSIS — J8410 Pulmonary fibrosis, unspecified: Secondary | ICD-10-CM | POA: Diagnosis present

## 2019-07-07 DIAGNOSIS — Z8616 Personal history of COVID-19: Secondary | ICD-10-CM | POA: Diagnosis not present

## 2019-07-07 NOTE — Progress Notes (Signed)
Daily Session Note  Patient Details  Name: Frederick Williams MRN: [id number] Date of Birth: 29-Jun-1960 Referring Provider:     Pulmonary Rehab Walk Test from 06/29/2019 in [place]  Referring Provider  Dr. Loanne Drilling      Encounter Date: 07/07/2019  Check In: Session Check In - 07/07/19 1034      Check-In   Supervising physician immediately available to respond to emergencies  [facility] immediately available    Physician(s)  Dr. Doristine Bosworth    Location  [facility]    Staff Present  Rosebud Poles, RN, Bjorn Loser, MS, Exercise Physiologist;Brittany Durene Fruits, BS, ACSM CEP, Exercise Physiologist    Virtual Visit  No    Medication changes reported      No    Fall or balance concerns reported     No    Tobacco Cessation  No Change    Warm-up and Cool-down  Performed as group-led instruction    Resistance Training Performed  Yes    VAD Patient?  No    PAD/SET Patient?  No      Pain Assessment   Currently in Pain?  No/denies    Multiple Pain Sites  No       Capillary Blood Glucose: No results found for this or any previous visit (from the past 24 hour(s)).    Social History   Tobacco Use  Smoking Status Former Smoker  . Packs/day: 2.00  . Years: 32.00  . Pack years: 64.00  . Types: Cigarettes  . Quit date: 2013  . Years since quitting: 8.1  Smokeless Tobacco Former Systems developer  . Quit date: 02/02/2011    Goals Met:  Independence with exercise equipment Exercise tolerated well Strength training completed today  Goals Unmet:  Not Applicable  Comments: Service time is from 1000 to 1115    Dr. Fransico Him is Medical Director for Cardiac Rehab at [facility].

## 2019-07-07 NOTE — Progress Notes (Deleted)
Daily Session Note  Patient Details  Name: Frederick Williams MRN: [id number] Date of Birth: 1960/09/19 Referring Provider:     Pulmonary Rehab Walk Test from 06/29/2019 in [place]  Referring Provider  Dr. Loanne Drilling      Encounter Date: 07/07/2019  Check In: Session Check In - 07/07/19 1034      Check-In   Supervising physician immediately available to respond to emergencies  [facility] immediately available    Physician(s)  Dr. Doristine Bosworth    Location  [facility]    Staff Present  Rosebud Poles, RN, Bjorn Loser, MS, Exercise Physiologist;Brittany Durene Fruits, BS, ACSM CEP, Exercise Physiologist    Virtual Visit  No    Medication changes reported      No    Fall or balance concerns reported     No    Tobacco Cessation  No Change    Warm-up and Cool-down  Performed as group-led instruction    Resistance Training Performed  Yes    VAD Patient?  No    PAD/SET Patient?  No      Pain Assessment   Currently in Pain?  No/denies    Multiple Pain Sites  No       Capillary Blood Glucose: No results found for this or any previous visit (from the past 24 hour(s)).    Social History   Tobacco Use  Smoking Status Former Smoker  . Packs/day: 2.00  . Years: 32.00  . Pack years: 64.00  . Types: Cigarettes  . Quit date: 2013  . Years since quitting: 8.1  Smokeless Tobacco Former Systems developer  . Quit date: 02/02/2011    Goals Met:  Independence with exercise equipment Exercise tolerated well Strength training completed today  Goals Unmet:  Not Applicable  Comments: Service time is from 1000 to 1115    Dr. Rush Farmer is Medical Director for Pulmonary Rehab at [facility].

## 2019-07-09 ENCOUNTER — Encounter (HOSPITAL_COMMUNITY)
Admission: RE | Admit: 2019-07-09 | Discharge: 2019-07-09 | Disposition: A | Discharge status: Home / Self Care | Payer: 59 | Source: Ambulatory Visit | Attending: Pulmonary Disease | Admitting: Pulmonary Disease

## 2019-07-09 ENCOUNTER — Other Ambulatory Visit: Payer: Self-pay

## 2019-07-09 VITALS — Wt 192.5 lb

## 2019-07-09 DIAGNOSIS — J8410 Pulmonary fibrosis, unspecified: Secondary | ICD-10-CM | POA: Diagnosis not present

## 2019-07-09 NOTE — Progress Notes (Signed)
Daily Session Note  Patient Details  Name: Frederick Williams MRN: [id number] Date of Birth: 06-Apr-1961 Referring Provider:     Pulmonary Rehab Walk Test from 06/29/2019 in [place]  Referring Provider  Dr. Loanne Drilling      Encounter Date: 07/09/2019  Check In: Session Check In - 07/09/19 1130      Check-In   Supervising physician immediately available to respond to emergencies  [facility] immediately available    Physician(s)  Dr. Broadus John    Location  [facility]    Staff Present  Rosebud Poles, RN, Bjorn Loser, MS, Exercise Physiologist;Brittany Durene Fruits, BS, ACSM CEP, Exercise Physiologist    Virtual Visit  No    Medication changes reported      No    Fall or balance concerns reported     No    Tobacco Cessation  No Change    Warm-up and Cool-down  Performed as group-led instruction    Resistance Training Performed  Yes    VAD Patient?  No    PAD/SET Patient?  No      Pain Assessment   Currently in Pain?  No/denies    Multiple Pain Sites  No       Capillary Blood Glucose: No results found for this or any previous visit (from the past 24 hour(s)).    Social History   Tobacco Use  Smoking Status Former Smoker  . Packs/day: 2.00  . Years: 32.00  . Pack years: 64.00  . Types: Cigarettes  . Quit date: 2013  . Years since quitting: 8.1  Smokeless Tobacco Former Systems developer  . Quit date: 02/02/2011    Goals Met:  Independence with exercise equipment Exercise tolerated well Strength training completed today  Goals Unmet:  Not Applicable  Comments: Service time is from [place] to [place]    Dr. Rush Farmer is Medical Director for Pulmonary Rehab at [facility].

## 2019-07-09 NOTE — Progress Notes (Signed)
Frederick Williams 59 y.o. male Nutrition Note Spoke with pt. Nutrition Plan and Nutrition Survey goals reviewed with pt.   Reviewed diet recall and pt health history. Pt feels he is healthy and has no chronic diseases in his family hx. Based on diet survey, he does make some healthy choices but has room for improvement.  We discussed cutting back alcohol intake to 2 beers per day. He reports quitting alcohol years ago until a doctor at work told him he had low cholesterol and he should "drink a few beers". Discussed health risks associated with excessive alcohol intake and poor diet.   Pt expressed understanding of the information reviewed.    Wt Readings from Last 3 Encounters:  06/29/19 191 lb 9.3 oz (86.9 kg)  06/18/19 199 lb 3.2 oz (90.4 kg)  05/13/19 190 lb 12.8 oz (86.5 kg)    Nutrition Diagnosis ? Excessive alcohol intake related to lack of interest in making healthy changes as evidenced by pt report of drinking 5 beers each day   Nutrition Intervention ? Pt's individual nutrition plan reviewed with pt.   ? Continue client-centered nutrition education by RD, as part of interdisciplinary care.  Goal(s) ? Pt to build a healthy plate including vegetables, fruits, whole grains, and low-fat dairy products in a heart healthy meal plan. ? Pt to decrease alcohol intake to two 12 oz beers per day  Plan:   Will provide client-centered nutrition education as part of interdisciplinary care  Monitor and evaluate progress toward nutrition goal with team.   Andrey Campanile, MS, RDN, LDN

## 2019-07-13 ENCOUNTER — Ambulatory Visit: Payer: 59 | Admitting: Pulmonary Disease

## 2019-07-13 NOTE — Telephone Encounter (Signed)
Pt called back- -said he missed a call around 4:45. I can't find anywhere that documents this call.

## 2019-07-13 NOTE — Telephone Encounter (Signed)
Spoke with Frederick Williams, JE nurse, and was advised that Dr. Everardo All is completing the forms now and will be ready tomorrow. Pt has a visit with Dr. Jeanene Erb and spoke to pt. Pt aware that the forms will be sent back once completed and to keep his appt tomorrow. Pt verbalized understanding and denied any further questions or concerns at this time.

## 2019-07-13 NOTE — Telephone Encounter (Signed)
Forms signed for FMLA to 12/05/19.

## 2019-07-13 NOTE — Telephone Encounter (Signed)
I have received these forms and will give them to Dr Everardo All

## 2019-07-14 ENCOUNTER — Encounter: Payer: Self-pay | Admitting: Pulmonary Disease

## 2019-07-14 ENCOUNTER — Other Ambulatory Visit: Payer: Self-pay

## 2019-07-14 ENCOUNTER — Ambulatory Visit (INDEPENDENT_AMBULATORY_CARE_PROVIDER_SITE_OTHER): Payer: 59

## 2019-07-14 ENCOUNTER — Telehealth: Payer: Self-pay | Admitting: Pulmonary Disease

## 2019-07-14 ENCOUNTER — Encounter: Payer: Self-pay | Admitting: *Deleted

## 2019-07-14 ENCOUNTER — Encounter (HOSPITAL_COMMUNITY): Payer: 59

## 2019-07-14 ENCOUNTER — Ambulatory Visit (INDEPENDENT_AMBULATORY_CARE_PROVIDER_SITE_OTHER): Payer: 59 | Admitting: Pulmonary Disease

## 2019-07-14 VITALS — BP 132/88 | HR 100 | Temp 98.1°F | Ht 72.0 in | Wt 190.8 lb

## 2019-07-14 DIAGNOSIS — U071 COVID-19: Secondary | ICD-10-CM

## 2019-07-14 DIAGNOSIS — R0600 Dyspnea, unspecified: Secondary | ICD-10-CM

## 2019-07-14 DIAGNOSIS — J1282 Pneumonia due to coronavirus disease 2019: Secondary | ICD-10-CM

## 2019-07-14 DIAGNOSIS — Z8616 Personal history of COVID-19: Secondary | ICD-10-CM

## 2019-07-14 DIAGNOSIS — J8410 Pulmonary fibrosis, unspecified: Secondary | ICD-10-CM

## 2019-07-14 DIAGNOSIS — R0609 Other forms of dyspnea: Secondary | ICD-10-CM

## 2019-07-14 MED ORDER — FLUTICASONE-SALMETEROL 232-14 MCG/ACT IN AEPB: 2.0000 | INHALATION_SPRAY | Freq: Two times a day (BID) | RESPIRATORY_TRACT | 6 refills | Status: DC

## 2019-07-14 NOTE — Progress Notes (Signed)
Subjective:   PATIENT ID: Frederick Williams GENDER: male DOB: 12/18/1960, MRN: [id number]   HPI  Chief Complaint  Patient presents with  . Follow-up    shortness of breath due to covid 19 virus   Reason for Visit: Follow-up for shortness of breath  [name] is a 59 year old male active smoker with  history of COVID-19 pneumonia who presents for follow-up.  Synopsis: Initially diagnosed with COVID-19 pneumonia on 03/16/2019 and required hospitalization from 11/17-11/22.  He was treated with IV dexamethasone and remdesivir.  At discharge he did not require home oxygen. Has tried to return to work however severely limited in walking short distances. Unable to perform his duties. 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 He has attempted to return to work and unsuccessful due to his physical limitations.  Notes reviewed in the EMR including Pulm Rehab from 3/2 and 3/4 and Pulmonary note from 06/18/19 by me. He has previously tried Spiriva without improvement.  Since our last visit, he was started on steroid taper and ICS/LABA (Advair). After completing the steroids, he reports his lung pain improved. However, he has not picked up the inhaler due to pharmacy communication. He reports recently working near [employer] and reports dizziness. He checked his O2 and it was 84% and took one minute to recover. He is currently participating in Pulmonary Rehab and at home he is walking more often. States that Pulmonary rehab is not challenging but then also reports being completely out of breath during certain exercises.  Social History: Former smoker x 32 years 1-2ppd. Stopped smoking cigarettes but was vaping 7 years. Quit vaping this year.  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  Past Medical History:  Diagnosis Date  . Pneumonia due to COVID-19 virus     Outpatient Medications Prior to Visit  Medication Sig Dispense Refill  . albuterol (VENTOLIN HFA) 108 (90 Base) MCG/ACT inhaler Inhale 1-2 puffs into the lungs every 2 (two) hours as needed for wheezing or shortness of breath. 18 g 1  . ipratropium-albuterol (DUONEB) 0.5-2.5 (3) MG/3ML SOLN Take 3 mLs by nebulization every 6 (six) hours as needed. 360 mL 3  . Fluticasone-Salmeterol 232-14 MCG/ACT AEPB Inhale 2 puffs into the lungs in the morning and at bedtime. (Patient not taking: Reported on 07/14/2019) 1 each 6  . Tiotropium Bromide Monohydrate (SPIRIVA RESPIMAT) 2.5 MCG/ACT AERS Inhale 2 puffs into the lungs daily. (Patient not taking: Reported on 07/14/2019) 4 g 3   No facility-administered medications prior to visit.    Review of Systems  Constitutional: Negative for chills, diaphoresis, fever, malaise/fatigue and weight loss.  HENT: Negative for congestion.   Respiratory: Positive for shortness of breath. Negative for cough, hemoptysis, sputum production and wheezing.   Cardiovascular: Negative for chest pain, palpitations and leg swelling.    Objective:   Vitals:   07/14/19 1108  BP: 132/88  Pulse: 100  Temp: 98.1 F (36.7 C)  TempSrc: Temporal  SpO2: 98%  Weight: 190 lb 12.8 oz (86.5 kg)  Height: 6' (1.829 m)   SpO2: 98 % O2 Device: None (Room air)  Physical Exam: General: Well-appearing, no acute distress HENT: [place], AT Eyes: EOMI, no scleral icterus Respiratory: Diminished breath sounds bilaterally.  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 Continues to persistent dyspnea. Has not tried ICS/LABA inhaler which was prescribed last visit. Participating in pulmonary rehab.  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 Will contact Dalton, exercise physiologist regarding patient's progress.  Follow-up in 3 months with NP  Health Maintenance  There is no immunization history on file for this patient.  CT Lung Screen - discuss at next visit  No orders of the defined types were placed in this encounter.  Meds ordered this encounter  Medications  . DISCONTD: Fluticasone-Salmeterol 232-14 MCG/ACT AEPB    Sig: Inhale 2 puffs into the lungs in the morning and at bedtime.    Dispense:  1 each    Refill:  6   Return in about 3 months (around 10/14/2019) for with NP.  I have spent a total time of 32-minutes on the day of the appointment reviewing prior documentation, coordinating care and discussing medical diagnosis and plan with the patient/family. Imaging, labs and tests included in this note have been reviewed and interpreted independently by me.  [place], MD [facility] 07/14/2019 11:41 AM  Office Number [phone]

## 2019-07-14 NOTE — Patient Instructions (Signed)
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

## 2019-07-14 NOTE — Telephone Encounter (Signed)
done

## 2019-07-16 ENCOUNTER — Telehealth: Payer: Self-pay | Admitting: Pulmonary Disease

## 2019-07-16 ENCOUNTER — Encounter (HOSPITAL_COMMUNITY)
Admission: RE | Admit: 2019-07-16 | Discharge: 2019-07-16 | Disposition: A | Discharge status: Home / Self Care | Payer: 59 | Source: Ambulatory Visit | Attending: Pulmonary Disease | Admitting: Pulmonary Disease

## 2019-07-16 ENCOUNTER — Other Ambulatory Visit: Payer: Self-pay

## 2019-07-16 DIAGNOSIS — J8410 Pulmonary fibrosis, unspecified: Secondary | ICD-10-CM | POA: Diagnosis not present

## 2019-07-16 NOTE — Telephone Encounter (Signed)
That is generic for Advair

## 2019-07-16 NOTE — Telephone Encounter (Signed)
I've called the pharmacy to confirm receipt of rx Advair. They said that it is come across as [facility] which is not covered by patient insurance. That's why need to know which Advair.

## 2019-07-16 NOTE — Telephone Encounter (Signed)
Spoke with pt, he states he was given Adviar and Spiriva from his pharmacy yesterday. He wsnt sure if he should be taking both. He remembers a conversation where Dr. Everardo All stated that he could discontinue the inhaler because it was ineffective. In the last note it didn't specify to stop taking Spiriva but only stated to start Advair. JE please clarify if pt needs to take both inhalers or just advair. Please advise.

## 2019-07-16 NOTE — Telephone Encounter (Signed)
Inhaler is already ordered on day of visit.  He is on Advair 232-14 TWO puffs TWICE a day. -JE

## 2019-07-16 NOTE — Telephone Encounter (Signed)
Which Advair 250 or 500?

## 2019-07-16 NOTE — Telephone Encounter (Signed)
Patient is only to be on Advair.  Please discontinue Spiriva.  Mechele Collin, M.D. [facility] 07/16/2019 2:37 PM

## 2019-07-16 NOTE — Progress Notes (Signed)
Daily Session Note  Patient Details  Name: Frederick Williams MRN: [id number] Date of Birth: Feb 26, 1961 Referring Provider:     Pulmonary Rehab Walk Test from 06/29/2019 in [place]  Referring Provider  Dr. Loanne Drilling      Encounter Date: 07/16/2019  Check In: Session Check In - 07/16/19 1117      Check-In   Supervising physician immediately available to respond to emergencies  [facility] immediately available    Physician(s)  Dr. Dessa Phi    Location  [facility]    Staff Present  Rosebud Poles, RN, Bjorn Loser, MS, Exercise Physiologist;Bernell Haynie Ysidro Evert, RN    Virtual Visit  No    Medication changes reported      No    Fall or balance concerns reported     No    Tobacco Cessation  No Change    Warm-up and Cool-down  Performed as group-led instruction    Resistance Training Performed  Yes    VAD Patient?  No    PAD/SET Patient?  No      Pain Assessment   Currently in Pain?  No/denies    Multiple Pain Sites  No       Capillary Blood Glucose: No results found for this or any previous visit (from the past 24 hour(s)).    Social History   Tobacco Use  Smoking Status Former Smoker  . Packs/day: 2.00  . Years: 32.00  . Pack years: 64.00  . Types: Cigarettes  . Quit date: 2013  . Years since quitting: 8.1  Smokeless Tobacco Former Systems developer  . Quit date: 02/02/2011    Goals Met:  Exercise tolerated well No report of cardiac concerns or symptoms Strength training completed today  Goals Unmet:  Not Applicable  Comments: Service time is from [place] to 1112    Dr. Fransico Him is Medical Director for Cardiac Rehab at [facility]

## 2019-07-16 NOTE — Telephone Encounter (Signed)
Pt was spoken to today about the spiriva and advair. Pt was told to not do the spiriva but to do the advair. spiriva was discontinued.

## 2019-07-16 NOTE — Telephone Encounter (Signed)
Pt called back-- said someone called and left message but pt said the message didn't answer questions-- he's confused about medication, he received a new prescription and doesn't understand why or what he is supposed to do with it.

## 2019-07-16 NOTE — Telephone Encounter (Signed)
Per [facility] Air duo was sent not Advair. Which strength of Advair?   Instructions    Return in about 3 months (around 10/14/2019) for with NP. 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

## 2019-07-17 MED ORDER — ADVAIR HFA 230-21 MCG/ACT IN AERO: 2.0000 | INHALATION_SPRAY | Freq: Two times a day (BID) | RESPIRATORY_TRACT | 12 refills | Status: DC

## 2019-07-17 NOTE — Telephone Encounter (Signed)
Advair HFA is a 230-21 strength.  The AirDuo comes in the 232-14 strength.    Advair HFA was sent in to pharmacy, and pharmacist verified that this rx is covered, which it is.    Spoke with patient to review inhalers with him, advised that he is to take the Advair HFA 2 puffs BID.  Pt expressed understanding.  Nothing further needed.  Forwarding message back to Dr. Everardo All as Lorain Childes as she had called me to to discuss this issue specifically.

## 2019-07-21 ENCOUNTER — Encounter (HOSPITAL_COMMUNITY): Payer: 59

## 2019-07-23 ENCOUNTER — Encounter (HOSPITAL_COMMUNITY)
Admission: RE | Admit: 2019-07-23 | Discharge: 2019-07-23 | Disposition: A | Discharge status: Home / Self Care | Payer: 59 | Source: Ambulatory Visit | Attending: Pulmonary Disease | Admitting: Pulmonary Disease

## 2019-07-23 ENCOUNTER — Other Ambulatory Visit: Payer: Self-pay

## 2019-07-23 DIAGNOSIS — J8410 Pulmonary fibrosis, unspecified: Secondary | ICD-10-CM

## 2019-07-23 NOTE — Progress Notes (Signed)
Daily Session Note  Patient Details  Name: Frederick Williams MRN: [id number] Date of Birth: 07/24/1960 Referring Provider:     Pulmonary Rehab Walk Test from 06/29/2019 in [facility]  Referring Provider  Dr. Ellison      Encounter Date: 07/23/2019  Check In: Session Check In - 07/23/19 1124      Check-In   Supervising physician immediately available to respond to emergencies  [facility] immediately available    Physician(s)  Dr. Lama    Location  [facility]    Staff Present  Joan Behrens, RN, BSN;Dalton Fletcher, MS, Exercise Physiologist;Lisa Hughes, RN    Virtual Visit  No    Medication changes reported      No    Fall or balance concerns reported     No    Tobacco Cessation  No Change    Warm-up and Cool-down  Performed as group-led instruction    Resistance Training Performed  Yes    VAD Patient?  No    PAD/SET Patient?  No      Pain Assessment   Currently in Pain?  No/denies    Multiple Pain Sites  No       Capillary Blood Glucose: No results found for this or any previous visit (from the past 24 hour(s)).    Social History   Tobacco Use  Smoking Status Former Smoker  . Packs/day: 2.00  . Years: 32.00  . Pack years: 64.00  . Types: Cigarettes  . Quit date: 2013  . Years since quitting: 8.2  Smokeless Tobacco Former User  . Quit date: 02/02/2011    Goals Met:  Exercise tolerated well No report of cardiac concerns or symptoms Strength training completed today  Goals Unmet:  Not Applicable  Comments: Service time is from 1003 to 1110    Dr. Traci Turner is Medical Director for Cardiac Rehab at [facility]. 

## 2019-07-28 ENCOUNTER — Other Ambulatory Visit: Payer: Self-pay

## 2019-07-28 ENCOUNTER — Telehealth: Payer: Self-pay | Admitting: Pulmonary Disease

## 2019-07-28 ENCOUNTER — Encounter (HOSPITAL_COMMUNITY)
Admission: RE | Admit: 2019-07-28 | Discharge: 2019-07-28 | Disposition: A | Discharge status: Home / Self Care | Payer: 59 | Source: Ambulatory Visit | Attending: Pulmonary Disease | Admitting: Pulmonary Disease

## 2019-07-28 VITALS — Wt 192.7 lb

## 2019-07-28 DIAGNOSIS — J8410 Pulmonary fibrosis, unspecified: Secondary | ICD-10-CM | POA: Diagnosis not present

## 2019-07-28 NOTE — Progress Notes (Signed)
Daily Session Note  Patient Details  Name: Frederick Williams MRN: [id number] Date of Birth: 12-20-1960 Referring Provider:     Pulmonary Rehab Walk Test from 06/29/2019 in [place]  Referring Provider  Dr. Loanne Drilling      Encounter Date: 07/28/2019  Check In: Session Check In - 07/28/19 1008      Check-In   Supervising physician immediately available to respond to emergencies  [facility] immediately available    Physician(s)  Dr. Darrick Meigs    Location  [facility]    Staff Present  Rosebud Poles, RN, Bjorn Loser, MS, Exercise Physiologist;Lisa Ysidro Evert, RN    Virtual Visit  No    Medication changes reported      No    Fall or balance concerns reported     No    Tobacco Cessation  No Change    Warm-up and Cool-down  Performed as group-led instruction    Resistance Training Performed  Yes    VAD Patient?  No    PAD/SET Patient?  No      Pain Assessment   Currently in Pain?  No/denies    Multiple Pain Sites  No       Capillary Blood Glucose: No results found for this or any previous visit (from the past 24 hour(s)).  Exercise Prescription Changes - 07/28/19 1200      Response to Exercise   Blood Pressure (Admit)  140/80    Blood Pressure (Exercise)  150/84    Blood Pressure (Exit)  112/74    Heart Rate (Admit)  88 bpm    Heart Rate (Exercise)  138 bpm    Heart Rate (Exit)  98 bpm    Oxygen Saturation (Admit)  96 %    Oxygen Saturation (Exercise)  95 %    Oxygen Saturation (Exit)  98 %    Rating of Perceived Exertion (Exercise)  17    Perceived Dyspnea (Exercise)  3    Duration  Continue with 30 min of aerobic exercise without signs/symptoms of physical distress.    Intensity  --   40-80% HRR     Progression   Progression  Continue to progress workloads to maintain intensity without signs/symptoms of physical distress.      Resistance Training   Training Prescription  Yes    Weight  blue bands    Reps  10-15    Time  10 Minutes      Treadmill   MPH  3    Grade  2    Minutes  15      Bike   Level  3    Minutes  15    METs  5      Home Exercise Plan   Plans to continue exercise at  Home (comment)    Frequency  Add 3 additional days to program exercise sessions.    Initial Home Exercises Provided  07/28/19       Social History   Tobacco Use  Smoking Status Former Smoker  . Packs/day: 2.00  . Years: 32.00  . Pack years: 64.00  . Types: Cigarettes  . Quit date: 2013  . Years since quitting: 8.2  Smokeless Tobacco Former Systems developer  . Quit date: 02/02/2011    Goals Met:  Independence with exercise equipment Exercise tolerated well Strength training completed today  Goals Unmet:  Not Applicable  Comments: Service time is from 1005 to 1107    Dr. Fransico Him  is Medical Director for Cardiac Rehab at [facility]. 

## 2019-07-28 NOTE — Progress Notes (Signed)
I have reviewed a Home Exercise Prescription with Frederick Williams is  currently exercising at home.  The patient was advised to walk and use resistance bands 3 days a week for 46-60 minutes.  [name] and I discussed how to progress their exercise prescription.  The patient stated that their goals were to lose weight and get back to work.  The patient stated that they understand the exercise prescription.  We reviewed exercise guidelines, target heart rate during exercise, RPE Scale, weather conditions, NTG use, endpoints for exercise, warmup and cool down.  Patient is encouraged to come to me with any questions. I will continue to follow up with the patient to assist them with progression and safety.

## 2019-07-28 NOTE — Progress Notes (Signed)
Pulmonary Individual Treatment Plan  Patient Details  Name: KYEN TAITE MRN: [id number] Date of Birth: [birth year] Referring Provider:     Pulmonary Rehab Walk Test from 06/29/2019 in [place]  Referring Provider  Dr. Loanne Drilling      Initial Encounter Date:    Pulmonary Rehab Walk Test from 06/29/2019 in [place]  Date  06/29/19      Visit Diagnosis: Lung fibrosis ([place])  Patient's Home Medications on Admission:   Current Outpatient Medications:  .  albuterol (VENTOLIN HFA) 108 (90 Base) MCG/ACT inhaler, Inhale 1-2 puffs into the lungs every 2 (two) hours as needed for wheezing or shortness of breath., Disp: 18 g, Rfl: 1 .  fluticasone-salmeterol (ADVAIR HFA) 230-21 MCG/ACT inhaler, Inhale 2 puffs into the lungs 2 (two) times daily., Disp: 1 Inhaler, Rfl: 12 .  ipratropium-albuterol (DUONEB) 0.5-2.5 (3) MG/3ML SOLN, Take 3 mLs by nebulization every 6 (six) hours as needed., Disp: 360 mL, Rfl: 3  Past Medical History: Past Medical History:  Diagnosis Date  . Pneumonia due to COVID-19 virus     Tobacco Use: Social History   Tobacco Use  Smoking Status Former Smoker  . Packs/day: 2.00  . Years: 32.00  . Pack years: 64.00  . Types: Cigarettes  . Quit date: 2013  . Years since quitting: 8.2  Smokeless Tobacco Former Systems developer  . Quit date: 02/02/2011    Labs: Recent Review Flowsheet Data    Labs for ITP Cardiac and Pulmonary Rehab Latest Ref Rng & Units 03/24/2019   Trlycerides <150 mg/dL 126      Capillary Blood Glucose: Lab Results  Component Value Date   GLUCAP 163 (H) 03/28/2019     Pulmonary Assessment Scores: Pulmonary Assessment Scores    Row Name 06/29/19 1403         ADL UCSD   ADL Phase  Entry     SOB Score total  16       CAT Score   CAT Score  16       UCSD: Self-administered rating of dyspnea associated with activities of daily living (ADLs) 6-point scale (0 = "not at all" to 5  = "maximal or unable to do because of breathlessness")  Scoring Scores range from 0 to 120.  Minimally important difference is 5 units  CAT: CAT can identify the health impairment of COPD patients and is better correlated with disease progression.  CAT has a scoring range of zero to 40. The CAT score is classified into four groups of low (less than 10), medium (10 - 20), high (21-30) and very high (31-40) based on the impact level of disease on health status. A CAT score over 10 suggests significant symptoms.  A worsening CAT score could be explained by an exacerbation, poor medication adherence, poor inhaler technique, or progression of COPD or comorbid conditions.  CAT MCID is 2 points  mMRC: mMRC (Modified Medical Research Council) Dyspnea Scale is used to assess the degree of baseline functional disability in patients of respiratory disease due to dyspnea. No minimal important difference is established. A decrease in score of 1 point or greater is considered a positive change.   Pulmonary Function Assessment:   Exercise Target Goals: Exercise Program Goal: Individual exercise prescription set using results from initial 6 min walk test and THRR while considering  patient's activity barriers and safety.   Exercise Prescription Goal: Initial exercise prescription builds to 30-45 minutes a day of aerobic  activity, 2-3 days per week.  Home exercise guidelines will be given to patient during program as part of exercise prescription that the participant will acknowledge.  Activity Barriers & Risk Stratification: Activity Barriers & Cardiac Risk Stratification - 06/29/19 1235      Activity Barriers & Cardiac Risk Stratification   Activity Barriers  Back Problems;Deconditioning;Muscular Weakness;Shortness of Breath       6 Minute Walk: 6 Minute Walk    Row Name 06/29/19 1328         6 Minute Walk   Phase  Initial     Distance  1470 feet     Walk Time  6 minutes     # of Rest Breaks   0     MPH  2.78     METS  4.48     RPE  12     Perceived Dyspnea   3     VO2 Peak  15.69     Symptoms  No     Resting HR  98 bpm     Resting BP  128/84     Resting Oxygen Saturation   95 %     Exercise Oxygen Saturation  during 6 min walk  94 %     Max Ex. HR  126 bpm     Max Ex. BP  152/80     2 Minute Post BP  124/86       Interval HR   1 Minute HR  108     2 Minute HR  111     3 Minute HR  120     4 Minute HR  126     5 Minute HR  107     6 Minute HR  108     2 Minute Post HR  96     Interval Heart Rate?  Yes       Interval Oxygen   Interval Oxygen?  Yes     Baseline Oxygen Saturation %  95 %     1 Minute Oxygen Saturation %  95 %     1 Minute Liters of Oxygen  0 L     2 Minute Oxygen Saturation %  94 %     2 Minute Liters of Oxygen  0 L     3 Minute Oxygen Saturation %  95 %     3 Minute Liters of Oxygen  0 L     4 Minute Oxygen Saturation %  95 %     4 Minute Liters of Oxygen  0 L     5 Minute Oxygen Saturation %  95 %     5 Minute Liters of Oxygen  0 L     6 Minute Oxygen Saturation %  96 %     6 Minute Liters of Oxygen  0 L     2 Minute Post Oxygen Saturation %  95 %     2 Minute Post Liters of Oxygen  0 L        Oxygen Initial Assessment: Oxygen Initial Assessment - 06/29/19 1328      Home Oxygen   Home Oxygen Device  None    Sleep Oxygen Prescription  None    Home Exercise Oxygen Prescription  None    Home at Rest Exercise Oxygen Prescription  None    Compliance with Home Oxygen Use  Yes      Initial 6 min Walk   Oxygen Used  None  Program Oxygen Prescription   Program Oxygen Prescription  None      Intervention   Short Term Goals  To learn and exhibit compliance with exercise, home and travel O2 prescription;To learn and understand importance of monitoring SPO2 with pulse oximeter and demonstrate accurate use of the pulse oximeter.;To learn and understand importance of maintaining oxygen saturations>88%;To learn and demonstrate proper  pursed lip breathing techniques or other breathing techniques.;To learn and demonstrate proper use of respiratory medications    Long  Term Goals  Exhibits compliance with exercise, home and travel O2 prescription;Verbalizes importance of monitoring SPO2 with pulse oximeter and return demonstration;Maintenance of O2 saturations>88%;Exhibits proper breathing techniques, such as pursed lip breathing or other method taught during program session;Compliance with respiratory medication;Demonstrates proper use of MDI's       Oxygen Re-Evaluation: Oxygen Re-Evaluation    Row Name 07/28/19 0706             Program Oxygen Prescription   Program Oxygen Prescription  None         Home Oxygen   Home Oxygen Device  None       Sleep Oxygen Prescription  None       Home Exercise Oxygen Prescription  None       Home at Rest Exercise Oxygen Prescription  None       Compliance with Home Oxygen Use  Yes         Goals/Expected Outcomes   Short Term Goals  To learn and exhibit compliance with exercise, home and travel O2 prescription;To learn and understand importance of monitoring SPO2 with pulse oximeter and demonstrate accurate use of the pulse oximeter.;To learn and understand importance of maintaining oxygen saturations>88%;To learn and demonstrate proper pursed lip breathing techniques or other breathing techniques.;To learn and demonstrate proper use of respiratory medications       Long  Term Goals  Exhibits compliance with exercise, home and travel O2 prescription;Verbalizes importance of monitoring SPO2 with pulse oximeter and return demonstration;Maintenance of O2 saturations>88%;Exhibits proper breathing techniques, such as pursed lip breathing or other method taught during program session;Compliance with respiratory medication;Demonstrates proper use of MDI's       Goals/Expected Outcomes  compliance          Oxygen Discharge (Final Oxygen Re-Evaluation): Oxygen Re-Evaluation - 07/28/19  0706      Program Oxygen Prescription   Program Oxygen Prescription  None      Home Oxygen   Home Oxygen Device  None    Sleep Oxygen Prescription  None    Home Exercise Oxygen Prescription  None    Home at Rest Exercise Oxygen Prescription  None    Compliance with Home Oxygen Use  Yes      Goals/Expected Outcomes   Short Term Goals  To learn and exhibit compliance with exercise, home and travel O2 prescription;To learn and understand importance of monitoring SPO2 with pulse oximeter and demonstrate accurate use of the pulse oximeter.;To learn and understand importance of maintaining oxygen saturations>88%;To learn and demonstrate proper pursed lip breathing techniques or other breathing techniques.;To learn and demonstrate proper use of respiratory medications    Long  Term Goals  Exhibits compliance with exercise, home and travel O2 prescription;Verbalizes importance of monitoring SPO2 with pulse oximeter and return demonstration;Maintenance of O2 saturations>88%;Exhibits proper breathing techniques, such as pursed lip breathing or other method taught during program session;Compliance with respiratory medication;Demonstrates proper use of MDI's    Goals/Expected Outcomes  compliance  Initial Exercise Prescription: Initial Exercise Prescription - 06/29/19 1300      Date of Initial Exercise RX and Referring Provider   Date  06/29/19    Referring Provider  Dr. Loanne Drilling      Treadmill   MPH  2    Grade  1    Minutes  15      Bike   Level  2    Minutes  15      Prescription Details   Frequency (times per week)  2    Duration  Progress to 30 minutes of continuous aerobic without signs/symptoms of physical distress      Intensity   THRR 40-80% of Max Heartrate  65-130    Ratings of Perceived Exertion  11-13    Perceived Dyspnea  0-4      Progression   Progression  Continue to progress workloads to maintain intensity without signs/symptoms of physical distress.       Resistance Training   Training Prescription  Yes    Weight  blue bands    Reps  10-15       Perform Capillary Blood Glucose checks as needed.  Exercise Prescription Changes: Exercise Prescription Changes    Row Name 07/09/19 1111             Response to Exercise   Blood Pressure (Admit)  138/84       Blood Pressure (Exercise)  150/84       Blood Pressure (Exit)  116/72       Heart Rate (Admit)  89 bpm       Heart Rate (Exercise)  125 bpm       Heart Rate (Exit)  97 bpm       Oxygen Saturation (Admit)  98 %       Oxygen Saturation (Exercise)  95 %       Oxygen Saturation (Exit)  94 %       Rating of Perceived Exertion (Exercise)  13       Perceived Dyspnea (Exercise)  1       Duration  Continue with 30 min of aerobic exercise without signs/symptoms of physical distress.       Intensity  -- 40-80% HRR         Progression   Progression  Continue to progress workloads to maintain intensity without signs/symptoms of physical distress.         Resistance Training   Training Prescription  Yes       Weight  blue bands       Reps  10-15       Time  10 Minutes         Treadmill   MPH  2.4       Grade  1       Minutes  15         Bike   Level  2.5       Minutes  15          Exercise Comments:   Exercise Goals and Review: Exercise Goals    Row Name 06/29/19 1333 07/28/19 0707           Exercise Goals   Increase Physical Activity  Yes  Yes      Intervention  Provide advice, education, support and counseling about physical activity/exercise needs.;Develop an individualized exercise prescription for aerobic and resistive training based on initial evaluation findings, risk stratification, comorbidities and participant's personal goals.  Provide advice,  education, support and counseling about physical activity/exercise needs.;Develop an individualized exercise prescription for aerobic and resistive training based on initial evaluation findings, risk stratification,  comorbidities and participant's personal goals.      Expected Outcomes  Short Term: Attend rehab on a regular basis to increase amount of physical activity.;Long Term: Add in home exercise to make exercise part of routine and to increase amount of physical activity.;Long Term: Exercising regularly at least 3-5 days a week.  Short Term: Attend rehab on a regular basis to increase amount of physical activity.;Long Term: Add in home exercise to make exercise part of routine and to increase amount of physical activity.;Long Term: Exercising regularly at least 3-5 days a week.      Increase Strength and Stamina  Yes  Yes      Intervention  Provide advice, education, support and counseling about physical activity/exercise needs.;Develop an individualized exercise prescription for aerobic and resistive training based on initial evaluation findings, risk stratification, comorbidities and participant's personal goals.  Provide advice, education, support and counseling about physical activity/exercise needs.;Develop an individualized exercise prescription for aerobic and resistive training based on initial evaluation findings, risk stratification, comorbidities and participant's personal goals.      Expected Outcomes  Short Term: Increase workloads from initial exercise prescription for resistance, speed, and METs.;Short Term: Perform resistance training exercises routinely during rehab and add in resistance training at home;Long Term: Improve cardiorespiratory fitness, muscular endurance and strength as measured by increased METs and functional capacity (6MWT)  Short Term: Increase workloads from initial exercise prescription for resistance, speed, and METs.;Short Term: Perform resistance training exercises routinely during rehab and add in resistance training at home;Long Term: Improve cardiorespiratory fitness, muscular endurance and strength as measured by increased METs and functional capacity (6MWT)      Able to  understand and use rate of perceived exertion (RPE) scale  Yes  Yes      Intervention  Provide education and explanation on how to use RPE scale  Provide education and explanation on how to use RPE scale      Expected Outcomes  Short Term: Able to use RPE daily in rehab to express subjective intensity level;Long Term:  Able to use RPE to guide intensity level when exercising independently  Short Term: Able to use RPE daily in rehab to express subjective intensity level;Long Term:  Able to use RPE to guide intensity level when exercising independently      Able to understand and use Dyspnea scale  Yes  Yes      Intervention  Provide education and explanation on how to use Dyspnea scale  Provide education and explanation on how to use Dyspnea scale      Expected Outcomes  Short Term: Able to use Dyspnea scale daily in rehab to express subjective sense of shortness of breath during exertion;Long Term: Able to use Dyspnea scale to guide intensity level when exercising independently  Short Term: Able to use Dyspnea scale daily in rehab to express subjective sense of shortness of breath during exertion;Long Term: Able to use Dyspnea scale to guide intensity level when exercising independently      Knowledge and understanding of Target Heart Rate Range (THRR)  Yes  Yes      Intervention  Provide education and explanation of THRR including how the numbers were predicted and where they are located for reference  --      Expected Outcomes  Short Term: Able to state/look up THRR;Short Term: Able to use daily  as guideline for intensity in rehab;Long Term: Able to use THRR to govern intensity when exercising independently  Short Term: Able to state/look up THRR;Short Term: Able to use daily as guideline for intensity in rehab;Long Term: Able to use THRR to govern intensity when exercising independently      Understanding of Exercise Prescription  Yes  Yes      Intervention  Provide education, explanation, and written  materials on patient's individual exercise prescription  Provide education, explanation, and written materials on patient's individual exercise prescription      Expected Outcomes  Short Term: Able to explain program exercise prescription;Long Term: Able to explain home exercise prescription to exercise independently  Short Term: Able to explain program exercise prescription;Long Term: Able to explain home exercise prescription to exercise independently         Exercise Goals Re-Evaluation : Exercise Goals Re-Evaluation    Row Name 07/28/19 0707             Exercise Goal Re-Evaluation   Exercise Goals Review  Increase Physical Activity;Increase Strength and Stamina;Able to understand and use rate of perceived exertion (RPE) scale;Able to understand and use Dyspnea scale;Knowledge and understanding of Target Heart Rate Range (THRR);Understanding of Exercise Prescription       Comments  Pt has attended 4 exercise sessions. We are pushing him hard so he will be able to return to work. Pt currently exercises at 3.0 METs on the upright bike. Will continue to monitor and progress as able.       Expected Outcomes  Through exercise at rehab and at home, the patient will decrease shortness of breath with daily activities and feel confident in carrying out an exercise regime at home.          Discharge Exercise Prescription (Final Exercise Prescription Changes): Exercise Prescription Changes - 07/09/19 1111      Response to Exercise   Blood Pressure (Admit)  138/84    Blood Pressure (Exercise)  150/84    Blood Pressure (Exit)  116/72    Heart Rate (Admit)  89 bpm    Heart Rate (Exercise)  125 bpm    Heart Rate (Exit)  97 bpm    Oxygen Saturation (Admit)  98 %    Oxygen Saturation (Exercise)  95 %    Oxygen Saturation (Exit)  94 %    Rating of Perceived Exertion (Exercise)  13    Perceived Dyspnea (Exercise)  1    Duration  Continue with 30 min of aerobic exercise without signs/symptoms of  physical distress.    Intensity  --   40-80% HRR     Progression   Progression  Continue to progress workloads to maintain intensity without signs/symptoms of physical distress.      Resistance Training   Training Prescription  Yes    Weight  blue bands    Reps  10-15    Time  10 Minutes      Treadmill   MPH  2.4    Grade  1    Minutes  15      Bike   Level  2.5    Minutes  15       Nutrition:  Target Goals: Understanding of nutrition guidelines, daily intake of sodium <156m, cholesterol <2051m calories 30% from fat and 7% or less from saturated fats, daily to have 5 or more servings of fruits and vegetables.  Biometrics: Pre Biometrics - 06/29/19 1307      Pre Biometrics  Height  6' (1.829 m)    Weight  86.9 kg    BMI (Calculated)  25.98    Grip Strength  40 kg        Nutrition Therapy Plan and Nutrition Goals: Nutrition Therapy & Goals - 07/27/19 1011      Nutrition Therapy   Diet  Mediterranean      Personal Nutrition Goals   Nutrition Goal  Pt to build a healthy plate including vegetables, fruits, whole grains, and low-fat dairy products in a heart healthy meal plan.    Personal Goal #2  Pt to decrease alcohol intake to two 12 oz beers per day      Intervention Plan   Intervention  Prescribe, educate and counsel regarding individualized specific dietary modifications aiming towards targeted core components such as weight, hypertension, lipid management, diabetes, heart failure and other comorbidities.;Nutrition handout(s) given to patient.    Expected Outcomes  Long Term Goal: Adherence to prescribed nutrition plan.;Short Term Goal: A plan has been developed with personal nutrition goals set during dietitian appointment.       Nutrition Assessments: Nutrition Assessments - 07/07/19 1007      Rate Your Plate Scores   Pre Score  49       Nutrition Goals Re-Evaluation: Nutrition Goals Re-Evaluation    [place] Name 07/27/19 1012             Goals    Current Weight  192 lb (87.1 kg)       Nutrition Goal  Pt to build a healthy plate including vegetables, fruits, whole grains, and low-fat dairy products in a heart healthy meal plan.         Personal Goal #2 Re-Evaluation   Personal Goal #2  Pt to decrease alcohol intake to two 12 oz beers per day          Nutrition Goals Discharge (Final Nutrition Goals Re-Evaluation): Nutrition Goals Re-Evaluation - 07/27/19 1012      Goals   Current Weight  192 lb (87.1 kg)    Nutrition Goal  Pt to build a healthy plate including vegetables, fruits, whole grains, and low-fat dairy products in a heart healthy meal plan.      Personal Goal #2 Re-Evaluation   Personal Goal #2  Pt to decrease alcohol intake to two 12 oz beers per day       Psychosocial: Target Goals: Acknowledge presence or absence of significant depression and/or stress, maximize coping skills, provide positive support system. Participant is able to verbalize types and ability to use techniques and skills needed for reducing stress and depression.  Initial Review & Psychosocial Screening: Initial Psych Review & Screening - 06/29/19 1240      Initial Review   Current issues with  None Identified      Family Dynamics   Good Support System?  Yes      Barriers   Psychosocial barriers to participate in program  There are no identifiable barriers or psychosocial needs.      Screening Interventions   Interventions  Encouraged to exercise       Quality of Life Scores:  Scores of 19 and below usually indicate a poorer quality of life in these areas.  A difference of  2-3 points is a clinically meaningful difference.  A difference of 2-3 points in the total score of the Quality of Life Index has been associated with significant improvement in overall quality of life, self-image, physical symptoms, and general health in studies  assessing change in quality of life.  PHQ-9: Recent Review Flowsheet Data    Depression screen [facility]  2/9 06/29/2019 04/10/2018 04/05/2017 04/02/2017 03/25/2017   Decreased Interest 0 0 0 0 0   Down, Depressed, Hopeless 0 0 0 0 0   PHQ - 2 Score 0 0 0 0 0   Altered sleeping 0 - - - -   Tired, decreased energy 0 - - - -   Change in appetite 0 - - - -   Feeling bad or failure about yourself  0 - - - -   Trouble concentrating 0 - - - -   Moving slowly or fidgety/restless 0 - - - -   Suicidal thoughts 0 - - - -   Difficult doing work/chores Not difficult at all - - - -     Interpretation of Total Score  Total Score Depression Severity:  1-4 = Minimal depression, 5-9 = Mild depression, 10-14 = Moderate depression, 15-19 = Moderately severe depression, 20-27 = Severe depression   Psychosocial Evaluation and Intervention: Psychosocial Evaluation - 06/29/19 1242      Psychosocial Evaluation & Interventions   Interventions  Encouraged to exercise with the program and follow exercise prescription    Comments  No barriers to participation in pulmonary rehab    Expected Outcomes  Will continue to have no barriers to participation in pulmonary rehab.    Continue Psychosocial Services   No Follow up required       Psychosocial Re-Evaluation: Psychosocial Re-Evaluation    [place] Name 07/27/19 1249             Psychosocial Re-Evaluation   Current issues with  None Identified       Comments  No psychosocial concerns identified at this time.       Expected Outcomes  That patient will continue to have no barriers or psychosocial concerns while in pulmonary rehab.       Interventions  Encouraged to attend Pulmonary Rehabilitation for the exercise       Continue Psychosocial Services   No Follow up required          Psychosocial Discharge (Final Psychosocial Re-Evaluation): Psychosocial Re-Evaluation - 07/27/19 1249      Psychosocial Re-Evaluation   Current issues with  None Identified    Comments  No psychosocial concerns identified at this time.    Expected Outcomes  That patient will  continue to have no barriers or psychosocial concerns while in pulmonary rehab.    Interventions  Encouraged to attend Pulmonary Rehabilitation for the exercise    Continue Psychosocial Services   No Follow up required       Education: Education Goals: Education classes will be provided on a weekly basis, covering required topics. Participant will state understanding/return demonstration of topics presented.  Learning Barriers/Preferences: Learning Barriers/Preferences - 06/29/19 1245      Learning Barriers/Preferences   Learning Barriers  None    Learning Preferences  Audio;Computer/Internet;Group Instruction;Individual Instruction;Pictoral;Skilled Demonstration;Verbal Instruction;Video;Written Material       Education Topics: Risk Factor Reduction:  -Group instruction that is supported by a PowerPoint presentation. Instructor discusses the definition of a risk factor, different risk factors for pulmonary disease, and how the heart and lungs work together.     Nutrition for Pulmonary Patient:  -Group instruction provided by PowerPoint slides, verbal discussion, and written materials to support subject matter. The instructor gives an explanation and review of healthy diet recommendations, which includes a discussion on weight management, recommendations  for fruit and vegetable consumption, as well as protein, fluid, caffeine, fiber, sodium, sugar, and alcohol. Tips for eating when patients are short of breath are discussed.   Pursed Lip Breathing:  -Group instruction that is supported by demonstration and informational handouts. Instructor discusses the benefits of pursed lip and diaphragmatic breathing and detailed demonstration on how to preform both.     Oxygen Safety:  -Group instruction provided by PowerPoint, verbal discussion, and written material to support subject matter. There is an overview of "What is Oxygen" and "Why do we need it".  Instructor also reviews how to create  a safe environment for oxygen use, the importance of using oxygen as prescribed, and the risks of noncompliance. There is a brief discussion on traveling with oxygen and resources the patient may utilize.   Oxygen Equipment:  -Group instruction provided by [facility] Staff utilizing handouts, written materials, and equipment demonstrations.   Signs and Symptoms:  -Group instruction provided by written material and verbal discussion to support subject matter. Warning signs and symptoms of infection, stroke, and heart attack are reviewed and when to call the physician/911 reinforced. Tips for preventing the spread of infection discussed.   Advanced Directives:  -Group instruction provided by verbal instruction and written material to support subject matter. Instructor reviews Advanced Directive laws and proper instruction for filling out document.   Pulmonary Video:  -Group video education that reviews the importance of medication and oxygen compliance, exercise, good nutrition, pulmonary hygiene, and pursed lip and diaphragmatic breathing for the pulmonary patient.   Exercise for the Pulmonary Patient:  -Group instruction that is supported by a PowerPoint presentation. Instructor discusses benefits of exercise, core components of exercise, frequency, duration, and intensity of an exercise routine, importance of utilizing pulse oximetry during exercise, safety while exercising, and options of places to exercise outside of rehab.     Pulmonary Medications:  -Verbally interactive group education provided by instructor with focus on inhaled medications and proper administration.   Anatomy and Physiology of the Respiratory System and Intimacy:  -Group instruction provided by PowerPoint, verbal discussion, and written material to support subject matter. Instructor reviews respiratory cycle and anatomical components of the respiratory system and their functions. Instructor also reviews  differences in obstructive and restrictive respiratory diseases with examples of each. Intimacy, Sex, and Sexuality differences are reviewed with a discussion on how relationships can change when diagnosed with pulmonary disease. Common sexual concerns are reviewed.   PULMONARY REHAB OTHER RESPIRATORY from 07/16/2019 in [place]  Date  07/16/19  Educator  Handout      MD DAY -A group question and answer session with a medical doctor that allows participants to ask questions that relate to their pulmonary disease state.   OTHER EDUCATION -Group or individual verbal, written, or video instructions that support the educational goals of the pulmonary rehab program.   PULMONARY REHAB OTHER RESPIRATORY from 07/16/2019 in [place]  Date  07/09/19  Educator  DF Gwyndolyn Kaufman your numbers]  Instruction Review Code  2- Demonstrated Understanding      Holiday Eating Survival Tips:  -Group instruction provided by PowerPoint slides, verbal discussion, and written materials to support subject matter. The instructor gives patients tips, tricks, and techniques to help them not only survive but enjoy the holidays despite the onslaught of food that accompanies the holidays.   Knowledge Questionnaire Score: Knowledge Questionnaire Score - 06/29/19 1404      Knowledge Questionnaire Score  Pre Score  17/18       Core Components/Risk Factors/Patient Goals at Admission: Personal Goals and Risk Factors at Admission - 06/29/19 1245      Core Components/Risk Factors/Patient Goals on Admission   Improve shortness of breath with ADL's  Yes    Intervention  Provide education, individualized exercise plan and daily activity instruction to help decrease symptoms of SOB with activities of daily living.    Expected Outcomes  Short Term: Improve cardiorespiratory fitness to achieve a reduction of symptoms when performing ADLs;Long Term: Be able to  perform more ADLs without symptoms or delay the onset of symptoms       Core Components/Risk Factors/Patient Goals Review:  Goals and Risk Factor Review    Row Name 06/29/19 1245 07/27/19 1305           Core Components/Risk Factors/Patient Goals Review   Personal Goals Review  Develop more efficient breathing techniques such as purse lipped breathing and diaphragmatic breathing and practicing self-pacing with activity.;Increase knowledge of respiratory medications and ability to use respiratory devices properly.;Improve shortness of breath with ADL's  Develop more efficient breathing techniques such as purse lipped breathing and diaphragmatic breathing and practicing self-pacing with activity.;Increase knowledge of respiratory medications and ability to use respiratory devices properly.;Improve shortness of breath with ADL's      Review  --  Iokepa has attended 4 exercise sessions and is currently exercising on the treadmill @ 2.6 mph and a 2% incline, and level 2.5 on the sci-fit bicycle.  He enjoys talking and it is a challenge keeping him focused, but he is progressing well.      Expected Outcomes  --  see admission goals         Core Components/Risk Factors/Patient Goals at Discharge (Final Review):  Goals and Risk Factor Review - 07/27/19 1305      Core Components/Risk Factors/Patient Goals Review   Personal Goals Review  Develop more efficient breathing techniques such as purse lipped breathing and diaphragmatic breathing and practicing self-pacing with activity.;Increase knowledge of respiratory medications and ability to use respiratory devices properly.;Improve shortness of breath with ADL's    Review  Jamerius has attended 4 exercise sessions and is currently exercising on the treadmill @ 2.6 mph and a 2% incline, and level 2.5 on the sci-fit bicycle.  He enjoys talking and it is a challenge keeping him focused, but he is progressing well.    Expected Outcomes  see admission goals        ITP Comments:   Comments: ITP REVIEW Pt is making expected progress toward pulmonary rehab goals after completing 4 sessions. Recommend continued exercise, life style modification, education, and utilization of breathing techniques to increase stamina and strength and decrease shortness of breath with exertion.

## 2019-07-28 NOTE — Telephone Encounter (Signed)
Called patient to advise this is being worked on.  Patient verbalized understanding.  I gave patient my direct number for any further questions.

## 2019-07-30 ENCOUNTER — Encounter (HOSPITAL_COMMUNITY)
Admission: RE | Admit: 2019-07-30 | Discharge: 2019-07-30 | Disposition: A | Discharge status: Home / Self Care | Payer: 59 | Source: Ambulatory Visit | Attending: Pulmonary Disease | Admitting: Pulmonary Disease

## 2019-07-30 ENCOUNTER — Other Ambulatory Visit: Payer: Self-pay

## 2019-07-30 DIAGNOSIS — J8410 Pulmonary fibrosis, unspecified: Secondary | ICD-10-CM

## 2019-07-30 NOTE — Progress Notes (Signed)
Daily Session Note  Patient Details  Name: Frederick Williams MRN: [id number] Date of Birth: December 21, 1960 Referring Provider:     Pulmonary Rehab Walk Test from 06/29/2019 in [place]  Referring Provider  Dr. Loanne Drilling      Encounter Date: 07/30/2019  Check In: Session Check In - 07/30/19 1148      Check-In   Supervising physician immediately available to respond to emergencies  [facility] immediately available    Physician(s)  Dr. Marylyn Ishihara    Location  [facility]    Staff Present  Hoy Register, MS, Exercise Physiologist;Greidys Deland Leonia Reeves, RN, BSN    Virtual Visit  No    Medication changes reported      No    Fall or balance concerns reported     No    Tobacco Cessation  No Change    Warm-up and Cool-down  Performed as group-led instruction    Resistance Training Performed  Yes    VAD Patient?  No    PAD/SET Patient?  No      Pain Assessment   Currently in Pain?  No/denies    Multiple Pain Sites  No       Capillary Blood Glucose: No results found for this or any previous visit (from the past 24 hour(s)).    Social History   Tobacco Use  Smoking Status Former Smoker  . Packs/day: 2.00  . Years: 32.00  . Pack years: 64.00  . Types: Cigarettes  . Quit date: 2013  . Years since quitting: 8.2  Smokeless Tobacco Former Systems developer  . Quit date: 02/02/2011    Goals Met:  Proper associated with RPD/PD & O2 Sat Exercise tolerated well Strength training completed today  Goals Unmet:  Not Applicable  Comments: Service time is from 1005 to 24    Dr. Fransico Him is Medical Director for Cardiac Rehab at [facility].

## 2019-08-04 ENCOUNTER — Other Ambulatory Visit: Payer: Self-pay

## 2019-08-04 ENCOUNTER — Encounter (HOSPITAL_COMMUNITY)
Admission: RE | Admit: 2019-08-04 | Discharge: 2019-08-04 | Disposition: A | Discharge status: Home / Self Care | Payer: 59 | Source: Ambulatory Visit | Attending: Pulmonary Disease | Admitting: Pulmonary Disease

## 2019-08-04 DIAGNOSIS — J8410 Pulmonary fibrosis, unspecified: Secondary | ICD-10-CM

## 2019-08-04 NOTE — Progress Notes (Signed)
Daily Session Note  Patient Details  Name: Frederick Williams MRN: [id number] Date of Birth: 05/09/1960 Referring Provider:     Pulmonary Rehab Walk Test from 06/29/2019 in [place]  Referring Provider  Dr. Loanne Drilling      Encounter Date: 08/04/2019  Check In: Session Check In - 08/04/19 1123      Check-In   Supervising physician immediately available to respond to emergencies  [facility] immediately available    Physician(s)  Dr. Sharlet Salina    Location  [facility]    Staff Present  Rosebud Poles, RN, Bjorn Loser, MS, Exercise Physiologist;Annissa Andreoni Ysidro Evert, RN    Virtual Visit  No    Medication changes reported      No    Fall or balance concerns reported     No    Tobacco Cessation  No Change    Warm-up and Cool-down  Performed as group-led instruction    Resistance Training Performed  Yes    VAD Patient?  No    PAD/SET Patient?  No      Pain Assessment   Currently in Pain?  No/denies    Multiple Pain Sites  No       Capillary Blood Glucose: No results found for this or any previous visit (from the past 24 hour(s)).    Social History   Tobacco Use  Smoking Status Former Smoker  . Packs/day: 2.00  . Years: 32.00  . Pack years: 64.00  . Types: Cigarettes  . Quit date: 2013  . Years since quitting: 8.2  Smokeless Tobacco Former Systems developer  . Quit date: 02/02/2011    Goals Met:  Exercise tolerated well No report of cardiac concerns or symptoms Strength training completed today  Goals Unmet:  Not Applicable  Comments: Service time is from 1005 to 1105    Dr. Fransico Him is Medical Director for Cardiac Rehab at [facility].

## 2019-08-06 ENCOUNTER — Encounter (HOSPITAL_COMMUNITY)
Admission: RE | Admit: 2019-08-06 | Discharge: 2019-08-06 | Disposition: A | Discharge status: Home / Self Care | Payer: 59 | Source: Ambulatory Visit | Attending: Pulmonary Disease | Admitting: Pulmonary Disease

## 2019-08-06 ENCOUNTER — Other Ambulatory Visit: Payer: Self-pay

## 2019-08-06 DIAGNOSIS — Z8616 Personal history of COVID-19: Secondary | ICD-10-CM | POA: Insufficient documentation

## 2019-08-06 DIAGNOSIS — J8410 Pulmonary fibrosis, unspecified: Secondary | ICD-10-CM | POA: Diagnosis present

## 2019-08-06 NOTE — Progress Notes (Signed)
Daily Session Note  Patient Details  Name: Frederick Williams MRN: [id number] Date of Birth: 10-15-1960 Referring Provider:     Pulmonary Rehab Walk Test from 06/29/2019 in [place]  Referring Provider  Dr. Loanne Drilling      Encounter Date: 08/06/2019  Check In: Session Check In - 08/06/19 1107      Check-In   Supervising physician immediately available to respond to emergencies  [facility] immediately available    Physician(s)  Dr. Loleta Books    Location  [facility]    Staff Present  Rosebud Poles, RN, Bjorn Loser, MS, Exercise Physiologist;Mayzee Reichenbach Ysidro Evert, RN    Virtual Visit  No    Medication changes reported      No    Fall or balance concerns reported     No    Tobacco Cessation  No Change    Warm-up and Cool-down  Performed as group-led instruction    Resistance Training Performed  Yes    VAD Patient?  No    PAD/SET Patient?  No      Pain Assessment   Currently in Pain?  No/denies    Multiple Pain Sites  No       Capillary Blood Glucose: No results found for this or any previous visit (from the past 24 hour(s)).    Social History   Tobacco Use  Smoking Status Former Smoker  . Packs/day: 2.00  . Years: 32.00  . Pack years: 64.00  . Types: Cigarettes  . Quit date: 2013  . Years since quitting: 8.2  Smokeless Tobacco Former Systems developer  . Quit date: 02/02/2011    Goals Met:  Exercise tolerated well No report of cardiac concerns or symptoms Strength training completed today  Goals Unmet:  Not Applicable  Comments: Service time is from 1002 to 1100    Dr. Fransico Him is Medical Director for Cardiac Rehab at [facility].

## 2019-08-11 ENCOUNTER — Encounter (HOSPITAL_COMMUNITY)
Admission: RE | Admit: 2019-08-11 | Discharge: 2019-08-11 | Disposition: A | Discharge status: Home / Self Care | Payer: 59 | Source: Ambulatory Visit | Attending: Pulmonary Disease | Admitting: Pulmonary Disease

## 2019-08-11 ENCOUNTER — Other Ambulatory Visit: Payer: Self-pay

## 2019-08-11 VITALS — Wt 192.5 lb

## 2019-08-11 DIAGNOSIS — J8410 Pulmonary fibrosis, unspecified: Secondary | ICD-10-CM

## 2019-08-11 NOTE — Progress Notes (Signed)
Daily Session Note  Patient Details  Name: Frederick Williams MRN: [id number] Date of Birth: Jul 10, 1960 Referring Provider:     Pulmonary Rehab Walk Test from 06/29/2019 in [place]  Referring Provider  Dr. Loanne Drilling      Encounter Date: 08/11/2019  Check In: Session Check In - 08/11/19 1121      Check-In   Supervising physician immediately available to respond to emergencies  [facility] immediately available    Physician(s)  Dr. Broadus John    Location  [facility]    Staff Present  Rosebud Poles, RN, Bjorn Loser, MS, Exercise Physiologist;Lisa Ysidro Evert, RN    Virtual Visit  No    Medication changes reported      No    Fall or balance concerns reported     No    Tobacco Cessation  No Change    Warm-up and Cool-down  Performed as group-led instruction    Resistance Training Performed  Yes    VAD Patient?  No    PAD/SET Patient?  No      Pain Assessment   Currently in Pain?  No/denies    Multiple Pain Sites  No       Capillary Blood Glucose: No results found for this or any previous visit (from the past 24 hour(s)).  Exercise Prescription Changes - 08/11/19 1153      Response to Exercise   Blood Pressure (Admit)  140/80    Blood Pressure (Exercise)  148/84    Blood Pressure (Exit)  110/64    Heart Rate (Admit)  87 bpm    Heart Rate (Exercise)  128 bpm    Heart Rate (Exit)  97 bpm    Oxygen Saturation (Admit)  97 %    Oxygen Saturation (Exercise)  95 %    Oxygen Saturation (Exit)  95 %    Rating of Perceived Exertion (Exercise)  17    Perceived Dyspnea (Exercise)  3.5    Duration  Continue with 30 min of aerobic exercise without signs/symptoms of physical distress.    Intensity  THRR New   65-145     Progression   Progression  Continue to progress workloads to maintain intensity without signs/symptoms of physical distress.      Resistance Training   Training Prescription  Yes    Weight  blue bands    Reps   10-15    Time  10 Minutes      Interval Training   Interval Training  No      Treadmill   MPH  3    Grade  3    Minutes  15      Bike   Level  3    Minutes  15    METs  5       Social History   Tobacco Use  Smoking Status Former Smoker  . Packs/day: 2.00  . Years: 32.00  . Pack years: 64.00  . Types: Cigarettes  . Quit date: 2013  . Years since quitting: 8.2  Smokeless Tobacco Former Systems developer  . Quit date: 02/02/2011    Goals Met:  Proper associated with RPD/PD & O2 Sat Exercise tolerated well Strength training completed today  Goals Unmet:  Not Applicable  Comments: Service time is from 1000 to 49    Dr. Fransico Him is Medical Director for Cardiac Rehab at [facility].

## 2019-08-13 ENCOUNTER — Encounter (HOSPITAL_COMMUNITY)
Admission: RE | Admit: 2019-08-13 | Discharge: 2019-08-13 | Disposition: A | Discharge status: Home / Self Care | Payer: 59 | Source: Ambulatory Visit | Attending: Pulmonary Disease | Admitting: Pulmonary Disease

## 2019-08-13 ENCOUNTER — Other Ambulatory Visit: Payer: Self-pay

## 2019-08-13 DIAGNOSIS — J8410 Pulmonary fibrosis, unspecified: Secondary | ICD-10-CM | POA: Diagnosis not present

## 2019-08-13 NOTE — Progress Notes (Signed)
Daily Session Note  Patient Details  Name: Frederick Williams MRN: [id number] Date of Birth: January 31, 1961 Referring Provider:     Pulmonary Rehab Walk Test from 06/29/2019 in [place]  Referring Provider  Dr. Loanne Drilling      Encounter Date: 08/13/2019  Check In: Session Check In - 08/13/19 1133      Check-In   Supervising physician immediately available to respond to emergencies  [facility] immediately available    Location  [facility]    Staff Present  Rosebud Poles, RN, Bjorn Loser, MS, Exercise Physiologist;Lisa Ysidro Evert, RN    Virtual Visit  No    Medication changes reported      No    Fall or balance concerns reported     No    Tobacco Cessation  No Change    Warm-up and Cool-down  Performed on first and last piece of equipment    Resistance Training Performed  Yes    VAD Patient?  No    PAD/SET Patient?  No      Pain Assessment   Currently in Pain?  No/denies    Multiple Pain Sites  No       Capillary Blood Glucose: No results found for this or any previous visit (from the past 24 hour(s)).    Social History   Tobacco Use  Smoking Status Former Smoker  . Packs/day: 2.00  . Years: 32.00  . Pack years: 64.00  . Types: Cigarettes  . Quit date: 2013  . Years since quitting: 8.2  Smokeless Tobacco Former Systems developer  . Quit date: 02/02/2011    Goals Met:  Independence with exercise equipment Exercise tolerated well Strength training completed today  Goals Unmet:  Not Applicable  Comments: Service time is from 1000 to 1108    Dr. Fransico Him is Medical Director for Cardiac Rehab at [facility].

## 2019-08-18 ENCOUNTER — Encounter (HOSPITAL_COMMUNITY)
Admission: RE | Admit: 2019-08-18 | Discharge: 2019-08-18 | Disposition: A | Discharge status: Home / Self Care | Payer: 59 | Source: Ambulatory Visit | Attending: Pulmonary Disease | Admitting: Pulmonary Disease

## 2019-08-18 ENCOUNTER — Other Ambulatory Visit: Payer: Self-pay

## 2019-08-18 DIAGNOSIS — J8410 Pulmonary fibrosis, unspecified: Secondary | ICD-10-CM

## 2019-08-18 NOTE — Progress Notes (Signed)
Daily Session Note  Patient Details  Name: Frederick Williams MRN: [id number] Date of Birth: Oct 03, 1960 Referring Provider:     Pulmonary Rehab Walk Test from 06/29/2019 in [place]  Referring Provider  Dr. Loanne Drilling      Encounter Date: 08/18/2019  Check In: Session Check In - 08/18/19 1000      Check-In   Supervising physician immediately available to respond to emergencies  [facility] immediately available    Physician(s)  Dr. Doristine Bosworth    Location  [facility]    Staff Present  Rosebud Poles, RN, Bjorn Loser, MS, Exercise Physiologist;Lisa Ysidro Evert, RN    Virtual Visit  No    Medication changes reported      No    Fall or balance concerns reported     No    Tobacco Cessation  No Change    Warm-up and Cool-down  Performed on first and last piece of equipment    Resistance Training Performed  Yes    VAD Patient?  No    PAD/SET Patient?  No      Pain Assessment   Currently in Pain?  No/denies    Multiple Pain Sites  No       Capillary Blood Glucose: No results found for this or any previous visit (from the past 24 hour(s)).    Social History   Tobacco Use  Smoking Status Former Smoker  . Packs/day: 2.00  . Years: 32.00  . Pack years: 64.00  . Types: Cigarettes  . Quit date: 2013  . Years since quitting: 8.2  Smokeless Tobacco Former Systems developer  . Quit date: 02/02/2011    Goals Met:  Proper associated with RPD/PD & O2 Sat Exercise tolerated well Strength training completed today  Goals Unmet:  Not Applicable  Comments: Service time is from 1005 to 1117    Dr. Fransico Him is Medical Director for Cardiac Rehab at [facility].

## 2019-08-20 ENCOUNTER — Ambulatory Visit (INDEPENDENT_AMBULATORY_CARE_PROVIDER_SITE_OTHER)
Admission: RE | Admit: 2019-08-20 | Discharge: 2019-08-20 | Disposition: A | Discharge status: Home / Self Care | Payer: 59 | Source: Ambulatory Visit | Attending: Pulmonary Disease | Admitting: Pulmonary Disease

## 2019-08-20 ENCOUNTER — Encounter (HOSPITAL_COMMUNITY)
Admission: RE | Admit: 2019-08-20 | Discharge: 2019-08-20 | Disposition: A | Discharge status: Home / Self Care | Payer: 59 | Source: Ambulatory Visit | Attending: Pulmonary Disease | Admitting: Pulmonary Disease

## 2019-08-20 ENCOUNTER — Other Ambulatory Visit: Payer: Self-pay

## 2019-08-20 DIAGNOSIS — Z8616 Personal history of COVID-19: Secondary | ICD-10-CM | POA: Diagnosis not present

## 2019-08-20 DIAGNOSIS — J8410 Pulmonary fibrosis, unspecified: Secondary | ICD-10-CM | POA: Diagnosis not present

## 2019-08-20 DIAGNOSIS — B948 Sequelae of other specified infectious and parasitic diseases: Secondary | ICD-10-CM | POA: Diagnosis not present

## 2019-08-20 DIAGNOSIS — R0602 Shortness of breath: Secondary | ICD-10-CM | POA: Diagnosis not present

## 2019-08-20 NOTE — Progress Notes (Signed)
Daily Session Note  Patient Details  Name: MOHID FURUYA MRN: [id number] Date of Birth: 04-03-1961 Referring Provider:     Pulmonary Rehab Walk Test from 06/29/2019 in [place]  Referring Provider  Dr. Loanne Drilling      Encounter Date: 08/20/2019  Check In: Session Check In - 08/20/19 1133      Check-In   Supervising physician immediately available to respond to emergencies  [facility] immediately available    Physician(s)  Dr. Erlinda Hong    Location  [facility]    Staff Present  Rosebud Poles, RN, Bjorn Loser, MS, Exercise Physiologist;Lisa Ysidro Evert, RN    Virtual Visit  No    Medication changes reported      No    Fall or balance concerns reported     No    Tobacco Cessation  No Change    Warm-up and Cool-down  Performed as group-led instruction    Resistance Training Performed  Yes    VAD Patient?  No    PAD/SET Patient?  No      Pain Assessment   Currently in Pain?  No/denies    Multiple Pain Sites  No       Capillary Blood Glucose: No results found for this or any previous visit (from the past 24 hour(s)).    Social History   Tobacco Use  Smoking Status Former Smoker  . Packs/day: 2.00  . Years: 32.00  . Pack years: 64.00  . Types: Cigarettes  . Quit date: 2013  . Years since quitting: 8.2  Smokeless Tobacco Former Systems developer  . Quit date: 02/02/2011    Goals Met:  Proper associated with RPD/PD & O2 Sat Exercise tolerated well Strength training completed today  Goals Unmet:  Not Applicable  Comments: Service time is from 1000 to 1110    Dr. Fransico Him is Medical Director for Cardiac Rehab at [facility].

## 2019-08-24 ENCOUNTER — Telehealth: Payer: Self-pay | Admitting: Pulmonary Disease

## 2019-08-24 NOTE — Telephone Encounter (Signed)
[facility] Telephone Encounter  CT reviewed. Resolution/improvement of bilateral ground glass opacities previously seen on CT from January 2021.  Please let patient know we can discuss these results further at his next visit as I am currently working in the hospital.  Mechele Collin, M.D. [facility]/Critical Care Medicine 08/24/2019 8:54 AM

## 2019-08-24 NOTE — Telephone Encounter (Signed)
Called pt and advised message from the provider. Pt understood and verbalized understanding. Nothing further is needed.    

## 2019-08-24 NOTE — Telephone Encounter (Signed)
Dr. Everardo All please review CT results.

## 2019-08-24 NOTE — Telephone Encounter (Signed)
Pt left a message on my voicemail requesting someone to go over his CT results with him.   [phone] or [phone].

## 2019-08-25 ENCOUNTER — Other Ambulatory Visit: Payer: Self-pay

## 2019-08-25 ENCOUNTER — Encounter (HOSPITAL_COMMUNITY)
Admission: RE | Admit: 2019-08-25 | Discharge: 2019-08-25 | Disposition: A | Discharge status: Home / Self Care | Payer: 59 | Source: Ambulatory Visit | Attending: Pulmonary Disease | Admitting: Pulmonary Disease

## 2019-08-25 VITALS — Wt 191.1 lb

## 2019-08-25 DIAGNOSIS — J8410 Pulmonary fibrosis, unspecified: Secondary | ICD-10-CM | POA: Diagnosis not present

## 2019-08-25 NOTE — Progress Notes (Signed)
Daily Session Note  Patient Details  Name: Frederick Williams MRN: [id number] Date of Birth: 07/18/1960 Referring Provider:     Pulmonary Rehab Walk Test from 06/29/2019 in [place]  Referring Provider  Dr. Loanne Drilling      Encounter Date: 08/25/2019  Check In: Session Check In - 08/25/19 1025      Check-In   Supervising physician immediately available to respond to emergencies  [facility] immediately available    Physician(s)  Dr. Leo Rod    Location  [facility]    Staff Present  Rosebud Poles, RN, Bjorn Loser, MS, Exercise Physiologist;Shuntell Foody Ysidro Evert, RN    Virtual Visit  No    Medication changes reported      No    Fall or balance concerns reported     No    Tobacco Cessation  No Change    Warm-up and Cool-down  Performed as group-led instruction    Resistance Training Performed  Yes    VAD Patient?  No    PAD/SET Patient?  No      Pain Assessment   Currently in Pain?  No/denies    Multiple Pain Sites  No       Capillary Blood Glucose: No results found for this or any previous visit (from the past 24 hour(s)).  Exercise Prescription Changes - 08/25/19 1100      Response to Exercise   Blood Pressure (Admit)  114/80    Blood Pressure (Exercise)  140/80    Blood Pressure (Exit)  114/70    Heart Rate (Admit)  90 bpm    Heart Rate (Exercise)  141 bpm    Heart Rate (Exit)  100 bpm    Oxygen Saturation (Admit)  96 %    Oxygen Saturation (Exercise)  94 %    Oxygen Saturation (Exit)  95 %    Rating of Perceived Exertion (Exercise)  15    Perceived Dyspnea (Exercise)  3.5    Duration  Continue with 30 min of aerobic exercise without signs/symptoms of physical distress.    Intensity  THRR unchanged      Progression   Progression  Continue to progress workloads to maintain intensity without signs/symptoms of physical distress.      Resistance Training   Training Prescription  Yes    Weight  blue bands    Reps   10-15    Time  10 Minutes      Treadmill   MPH  3    Grade  3    Minutes  15      Bike   Level  35    Minutes  15    METs  5.1       Social History   Tobacco Use  Smoking Status Former Smoker  . Packs/day: 2.00  . Years: 32.00  . Pack years: 64.00  . Types: Cigarettes  . Quit date: 2013  . Years since quitting: 8.3  Smokeless Tobacco Former Systems developer  . Quit date: 02/02/2011    Goals Met:  Exercise tolerated well No report of cardiac concerns or symptoms Strength training completed today  Goals Unmet:  Not Applicable  Comments: Service time is from 1000 to 1110    Dr. Fransico Him is Medical Director for Cardiac Rehab at [facility].

## 2019-08-26 NOTE — Progress Notes (Signed)
Pulmonary Individual Treatment Plan  Patient Details  Name: EDKER PUNT MRN: [id number] Date of Birth: January 09, 1961 Referring Provider:     Pulmonary Rehab Walk Test from 06/29/2019 in [place]  Referring Provider  Dr. Loanne Drilling      Initial Encounter Date:    Pulmonary Rehab Walk Test from 06/29/2019 in [place]  Date  06/29/19      Visit Diagnosis: Lung fibrosis ([place])  Patient's Home Medications on Admission:   Current Outpatient Medications:  .  albuterol (VENTOLIN HFA) 108 (90 Base) MCG/ACT inhaler, Inhale 1-2 puffs into the lungs every 2 (two) hours as needed for wheezing or shortness of breath., Disp: 18 g, Rfl: 1 .  fluticasone-salmeterol (ADVAIR HFA) 230-21 MCG/ACT inhaler, Inhale 2 puffs into the lungs 2 (two) times daily., Disp: 1 Inhaler, Rfl: 12 .  ipratropium-albuterol (DUONEB) 0.5-2.5 (3) MG/3ML SOLN, Take 3 mLs by nebulization every 6 (six) hours as needed., Disp: 360 mL, Rfl: 3  Past Medical History: Past Medical History:  Diagnosis Date  . Pneumonia due to COVID-19 virus     Tobacco Use: Social History   Tobacco Use  Smoking Status Former Smoker  . Packs/day: 2.00  . Years: 32.00  . Pack years: 64.00  . Types: Cigarettes  . Quit date: 2013  . Years since quitting: 8.3  Smokeless Tobacco Former Systems developer  . Quit date: 02/02/2011    Labs: Recent Review Flowsheet Data    Labs for ITP Cardiac and Pulmonary Rehab Latest Ref Rng & Units 03/24/2019   Trlycerides <150 mg/dL 126      Capillary Blood Glucose: Lab Results  Component Value Date   GLUCAP 163 (H) 03/28/2019     Pulmonary Assessment Scores: Pulmonary Assessment Scores    Row Name 06/29/19 1403         ADL UCSD   ADL Phase  Entry     SOB Score total  16       CAT Score   CAT Score  16       UCSD: Self-administered rating of dyspnea associated with activities of daily living (ADLs) 6-point scale (0 = "not at all" to 5  = "maximal or unable to do because of breathlessness")  Scoring Scores range from 0 to 120.  Minimally important difference is 5 units  CAT: CAT can identify the health impairment of COPD patients and is better correlated with disease progression.  CAT has a scoring range of zero to 40. The CAT score is classified into four groups of low (less than 10), medium (10 - 20), high (21-30) and very high (31-40) based on the impact level of disease on health status. A CAT score over 10 suggests significant symptoms.  A worsening CAT score could be explained by an exacerbation, poor medication adherence, poor inhaler technique, or progression of COPD or comorbid conditions.  CAT MCID is 2 points  mMRC: mMRC (Modified Medical Research Council) Dyspnea Scale is used to assess the degree of baseline functional disability in patients of respiratory disease due to dyspnea. No minimal important difference is established. A decrease in score of 1 point or greater is considered a positive change.   Pulmonary Function Assessment:   Exercise Target Goals: Exercise Program Goal: Individual exercise prescription set using results from initial 6 min walk test and THRR while considering  patient's activity barriers and safety.   Exercise Prescription Goal: Initial exercise prescription builds to 30-45 minutes a day of aerobic  activity, 2-3 days per week.  Home exercise guidelines will be given to patient during program as part of exercise prescription that the participant will acknowledge.  Activity Barriers & Risk Stratification: Activity Barriers & Cardiac Risk Stratification - 06/29/19 1235      Activity Barriers & Cardiac Risk Stratification   Activity Barriers  Back Problems;Deconditioning;Muscular Weakness;Shortness of Breath       6 Minute Walk: 6 Minute Walk    Row Name 06/29/19 1328         6 Minute Walk   Phase  Initial     Distance  1470 feet     Walk Time  6 minutes     # of Rest Breaks   0     MPH  2.78     METS  4.48     RPE  12     Perceived Dyspnea   3     VO2 Peak  15.69     Symptoms  No     Resting HR  98 bpm     Resting BP  128/84     Resting Oxygen Saturation   95 %     Exercise Oxygen Saturation  during 6 min walk  94 %     Max Ex. HR  126 bpm     Max Ex. BP  152/80     2 Minute Post BP  124/86       Interval HR   1 Minute HR  108     2 Minute HR  111     3 Minute HR  120     4 Minute HR  126     5 Minute HR  107     6 Minute HR  108     2 Minute Post HR  96     Interval Heart Rate?  Yes       Interval Oxygen   Interval Oxygen?  Yes     Baseline Oxygen Saturation %  95 %     1 Minute Oxygen Saturation %  95 %     1 Minute Liters of Oxygen  0 L     2 Minute Oxygen Saturation %  94 %     2 Minute Liters of Oxygen  0 L     3 Minute Oxygen Saturation %  95 %     3 Minute Liters of Oxygen  0 L     4 Minute Oxygen Saturation %  95 %     4 Minute Liters of Oxygen  0 L     5 Minute Oxygen Saturation %  95 %     5 Minute Liters of Oxygen  0 L     6 Minute Oxygen Saturation %  96 %     6 Minute Liters of Oxygen  0 L     2 Minute Post Oxygen Saturation %  95 %     2 Minute Post Liters of Oxygen  0 L        Oxygen Initial Assessment: Oxygen Initial Assessment - 06/29/19 1328      Home Oxygen   Home Oxygen Device  None    Sleep Oxygen Prescription  None    Home Exercise Oxygen Prescription  None    Home at Rest Exercise Oxygen Prescription  None    Compliance with Home Oxygen Use  Yes      Initial 6 min Walk   Oxygen Used  None  Program Oxygen Prescription   Program Oxygen Prescription  None      Intervention   Short Term Goals  To learn and exhibit compliance with exercise, home and travel O2 prescription;To learn and understand importance of monitoring SPO2 with pulse oximeter and demonstrate accurate use of the pulse oximeter.;To learn and understand importance of maintaining oxygen saturations>88%;To learn and demonstrate proper  pursed lip breathing techniques or other breathing techniques.;To learn and demonstrate proper use of respiratory medications    Long  Term Goals  Exhibits compliance with exercise, home and travel O2 prescription;Verbalizes importance of monitoring SPO2 with pulse oximeter and return demonstration;Maintenance of O2 saturations>88%;Exhibits proper breathing techniques, such as pursed lip breathing or other method taught during program session;Compliance with respiratory medication;Demonstrates proper use of MDI's       Oxygen Re-Evaluation: Oxygen Re-Evaluation    Row Name 07/28/19 0706 08/25/19 0734           Program Oxygen Prescription   Program Oxygen Prescription  None  None        Home Oxygen   Home Oxygen Device  None  None      Sleep Oxygen Prescription  None  None      Home Exercise Oxygen Prescription  None  None      Home at Rest Exercise Oxygen Prescription  None  None      Compliance with Home Oxygen Use  Yes  Yes        Goals/Expected Outcomes   Short Term Goals  To learn and exhibit compliance with exercise, home and travel O2 prescription;To learn and understand importance of monitoring SPO2 with pulse oximeter and demonstrate accurate use of the pulse oximeter.;To learn and understand importance of maintaining oxygen saturations>88%;To learn and demonstrate proper pursed lip breathing techniques or other breathing techniques.;To learn and demonstrate proper use of respiratory medications  To learn and exhibit compliance with exercise, home and travel O2 prescription;To learn and understand importance of monitoring SPO2 with pulse oximeter and demonstrate accurate use of the pulse oximeter.;To learn and understand importance of maintaining oxygen saturations>88%;To learn and demonstrate proper pursed lip breathing techniques or other breathing techniques.;To learn and demonstrate proper use of respiratory medications      Long  Term Goals  Exhibits compliance with exercise,  home and travel O2 prescription;Verbalizes importance of monitoring SPO2 with pulse oximeter and return demonstration;Maintenance of O2 saturations>88%;Exhibits proper breathing techniques, such as pursed lip breathing or other method taught during program session;Compliance with respiratory medication;Demonstrates proper use of MDI's  Exhibits compliance with exercise, home and travel O2 prescription;Verbalizes importance of monitoring SPO2 with pulse oximeter and return demonstration;Maintenance of O2 saturations>88%;Exhibits proper breathing techniques, such as pursed lip breathing or other method taught during program session;Compliance with respiratory medication;Demonstrates proper use of MDI's      Goals/Expected Outcomes  compliance  compliance         Oxygen Discharge (Final Oxygen Re-Evaluation): Oxygen Re-Evaluation - 08/25/19 0734      Program Oxygen Prescription   Program Oxygen Prescription  None      Home Oxygen   Home Oxygen Device  None    Sleep Oxygen Prescription  None    Home Exercise Oxygen Prescription  None    Home at Rest Exercise Oxygen Prescription  None    Compliance with Home Oxygen Use  Yes      Goals/Expected Outcomes   Short Term Goals  To learn and exhibit compliance with exercise, home and travel O2 prescription;To learn  and understand importance of monitoring SPO2 with pulse oximeter and demonstrate accurate use of the pulse oximeter.;To learn and understand importance of maintaining oxygen saturations>88%;To learn and demonstrate proper pursed lip breathing techniques or other breathing techniques.;To learn and demonstrate proper use of respiratory medications    Long  Term Goals  Exhibits compliance with exercise, home and travel O2 prescription;Verbalizes importance of monitoring SPO2 with pulse oximeter and return demonstration;Maintenance of O2 saturations>88%;Exhibits proper breathing techniques, such as pursed lip breathing or other method taught  during program session;Compliance with respiratory medication;Demonstrates proper use of MDI's    Goals/Expected Outcomes  compliance       Initial Exercise Prescription: Initial Exercise Prescription - 06/29/19 1300      Date of Initial Exercise RX and Referring Provider   Date  06/29/19    Referring Provider  Dr. Loanne Drilling      Treadmill   MPH  2    Grade  1    Minutes  15      Bike   Level  2    Minutes  15      Prescription Details   Frequency (times per week)  2    Duration  Progress to 30 minutes of continuous aerobic without signs/symptoms of physical distress      Intensity   THRR 40-80% of Max Heartrate  65-130    Ratings of Perceived Exertion  11-13    Perceived Dyspnea  0-4      Progression   Progression  Continue to progress workloads to maintain intensity without signs/symptoms of physical distress.      Resistance Training   Training Prescription  Yes    Weight  blue bands    Reps  10-15       Perform Capillary Blood Glucose checks as needed.  Exercise Prescription Changes: Exercise Prescription Changes    Row Name 07/09/19 1111 07/28/19 1200 08/11/19 1153 08/25/19 1100       Response to Exercise   Blood Pressure (Admit)  138/84  140/80  140/80  114/80    Blood Pressure (Exercise)  150/84  150/84  148/84  140/80    Blood Pressure (Exit)  116/72  112/74  110/64  114/70    Heart Rate (Admit)  89 bpm  88 bpm  87 bpm  90 bpm    Heart Rate (Exercise)  125 bpm  138 bpm  128 bpm  141 bpm    Heart Rate (Exit)  97 bpm  98 bpm  97 bpm  100 bpm    Oxygen Saturation (Admit)  98 %  96 %  97 %  96 %    Oxygen Saturation (Exercise)  95 %  95 %  95 %  94 %    Oxygen Saturation (Exit)  94 %  98 %  95 %  95 %    Rating of Perceived Exertion (Exercise)  '13  17  17  15    '$ Perceived Dyspnea (Exercise)  1  3  3.5  3.5    Duration  Continue with 30 min of aerobic exercise without signs/symptoms of physical distress.  Continue with 30 min of aerobic exercise without  signs/symptoms of physical distress.  Continue with 30 min of aerobic exercise without signs/symptoms of physical distress.  Continue with 30 min of aerobic exercise without signs/symptoms of physical distress.    Intensity  -- 40-80% HRR  -- 40-80% HRR  THRR New 65-145  THRR unchanged      Progression  Progression  Continue to progress workloads to maintain intensity without signs/symptoms of physical distress.  Continue to progress workloads to maintain intensity without signs/symptoms of physical distress.  Continue to progress workloads to maintain intensity without signs/symptoms of physical distress.  Continue to progress workloads to maintain intensity without signs/symptoms of physical distress.      Resistance Training   Training Prescription  Yes  Yes  Yes  Yes    Weight  blue bands  blue bands  blue bands  blue bands    Reps  10-15  10-15  10-15  10-15    Time  10 Minutes  10 Minutes  10 Minutes  10 Minutes      Interval Training   Interval Training  --  --  No  --      Treadmill   MPH  2.'4  3  3  3    '$ Grade  '1  2  3  3    '$ Minutes  '15  15  15  15      '$ Bike   Level  2.'5  3  3  '$ 35    Minutes  '15  15  15  15    '$ METs  --  5  5  5.1      Home Exercise Plan   Plans to continue exercise at  --  Home (comment)  --  --    Frequency  --  Add 3 additional days to program exercise sessions.  --  --    Initial Home Exercises Provided  --  07/28/19  --  --       Exercise Comments: Exercise Comments    Row Name 07/28/19 1205           Exercise Comments  home exercise completed          Exercise Goals and Review: Exercise Goals    Row Name 06/29/19 1333 07/28/19 0707 08/25/19 0735         Exercise Goals   Increase Physical Activity  Yes  Yes  Yes     Intervention  Provide advice, education, support and counseling about physical activity/exercise needs.;Develop an individualized exercise prescription for aerobic and resistive training based on initial evaluation  findings, risk stratification, comorbidities and participant's personal goals.  Provide advice, education, support and counseling about physical activity/exercise needs.;Develop an individualized exercise prescription for aerobic and resistive training based on initial evaluation findings, risk stratification, comorbidities and participant's personal goals.  Provide advice, education, support and counseling about physical activity/exercise needs.;Develop an individualized exercise prescription for aerobic and resistive training based on initial evaluation findings, risk stratification, comorbidities and participant's personal goals.     Expected Outcomes  Short Term: Attend rehab on a regular basis to increase amount of physical activity.;Long Term: Add in home exercise to make exercise part of routine and to increase amount of physical activity.;Long Term: Exercising regularly at least 3-5 days a week.  Short Term: Attend rehab on a regular basis to increase amount of physical activity.;Long Term: Add in home exercise to make exercise part of routine and to increase amount of physical activity.;Long Term: Exercising regularly at least 3-5 days a week.  Short Term: Attend rehab on a regular basis to increase amount of physical activity.;Long Term: Add in home exercise to make exercise part of routine and to increase amount of physical activity.;Long Term: Exercising regularly at least 3-5 days a week.     Increase Strength and Stamina  Yes  Yes  Yes     Intervention  Provide advice, education, support and counseling about physical activity/exercise needs.;Develop an individualized exercise prescription for aerobic and resistive training based on initial evaluation findings, risk stratification, comorbidities and participant's personal goals.  Provide advice, education, support and counseling about physical activity/exercise needs.;Develop an individualized exercise prescription for aerobic and resistive training  based on initial evaluation findings, risk stratification, comorbidities and participant's personal goals.  Provide advice, education, support and counseling about physical activity/exercise needs.;Develop an individualized exercise prescription for aerobic and resistive training based on initial evaluation findings, risk stratification, comorbidities and participant's personal goals.     Expected Outcomes  Short Term: Increase workloads from initial exercise prescription for resistance, speed, and METs.;Short Term: Perform resistance training exercises routinely during rehab and add in resistance training at home;Long Term: Improve cardiorespiratory fitness, muscular endurance and strength as measured by increased METs and functional capacity (6MWT)  Short Term: Increase workloads from initial exercise prescription for resistance, speed, and METs.;Short Term: Perform resistance training exercises routinely during rehab and add in resistance training at home;Long Term: Improve cardiorespiratory fitness, muscular endurance and strength as measured by increased METs and functional capacity (6MWT)  Short Term: Increase workloads from initial exercise prescription for resistance, speed, and METs.;Short Term: Perform resistance training exercises routinely during rehab and add in resistance training at home;Long Term: Improve cardiorespiratory fitness, muscular endurance and strength as measured by increased METs and functional capacity (6MWT)     Able to understand and use rate of perceived exertion (RPE) scale  Yes  Yes  Yes     Intervention  Provide education and explanation on how to use RPE scale  Provide education and explanation on how to use RPE scale  Provide education and explanation on how to use RPE scale     Expected Outcomes  Short Term: Able to use RPE daily in rehab to express subjective intensity level;Long Term:  Able to use RPE to guide intensity level when exercising independently  Short Term:  Able to use RPE daily in rehab to express subjective intensity level;Long Term:  Able to use RPE to guide intensity level when exercising independently  Short Term: Able to use RPE daily in rehab to express subjective intensity level;Long Term:  Able to use RPE to guide intensity level when exercising independently     Able to understand and use Dyspnea scale  Yes  Yes  Yes     Intervention  Provide education and explanation on how to use Dyspnea scale  Provide education and explanation on how to use Dyspnea scale  Provide education and explanation on how to use Dyspnea scale     Expected Outcomes  Short Term: Able to use Dyspnea scale daily in rehab to express subjective sense of shortness of breath during exertion;Long Term: Able to use Dyspnea scale to guide intensity level when exercising independently  Short Term: Able to use Dyspnea scale daily in rehab to express subjective sense of shortness of breath during exertion;Long Term: Able to use Dyspnea scale to guide intensity level when exercising independently  Short Term: Able to use Dyspnea scale daily in rehab to express subjective sense of shortness of breath during exertion;Long Term: Able to use Dyspnea scale to guide intensity level when exercising independently     Knowledge and understanding of Target Heart Rate Range (THRR)  Yes  Yes  Yes     Intervention  Provide education and explanation of THRR including how the numbers were predicted and  where they are located for reference  --  Provide education and explanation of THRR including how the numbers were predicted and where they are located for reference     Expected Outcomes  Short Term: Able to state/look up THRR;Short Term: Able to use daily as guideline for intensity in rehab;Long Term: Able to use THRR to govern intensity when exercising independently  Short Term: Able to state/look up THRR;Short Term: Able to use daily as guideline for intensity in rehab;Long Term: Able to use THRR to  govern intensity when exercising independently  Short Term: Able to state/look up THRR;Short Term: Able to use daily as guideline for intensity in rehab;Long Term: Able to use THRR to govern intensity when exercising independently     Understanding of Exercise Prescription  Yes  Yes  Yes     Intervention  Provide education, explanation, and written materials on patient's individual exercise prescription  Provide education, explanation, and written materials on patient's individual exercise prescription  Provide education, explanation, and written materials on patient's individual exercise prescription     Expected Outcomes  Short Term: Able to explain program exercise prescription;Long Term: Able to explain home exercise prescription to exercise independently  Short Term: Able to explain program exercise prescription;Long Term: Able to explain home exercise prescription to exercise independently  Short Term: Able to explain program exercise prescription;Long Term: Able to explain home exercise prescription to exercise independently        Exercise Goals Re-Evaluation : Exercise Goals Re-Evaluation    Row Name 07/28/19 0707 08/25/19 0736           Exercise Goal Re-Evaluation   Exercise Goals Review  Increase Physical Activity;Increase Strength and Stamina;Able to understand and use rate of perceived exertion (RPE) scale;Able to understand and use Dyspnea scale;Knowledge and understanding of Target Heart Rate Range (THRR);Understanding of Exercise Prescription  Increase Physical Activity;Increase Strength and Stamina;Able to understand and use rate of perceived exertion (RPE) scale;Able to understand and use Dyspnea scale;Knowledge and understanding of Target Heart Rate Range (THRR);Understanding of Exercise Prescription      Comments  Pt has attended 4 exercise sessions. We are pushing him hard so he will be able to return to work. Pt currently exercises at 3.0 METs on the upright bike. Will continue  to monitor and progress as able.  Pt has attended 12 exercise sessions. Pt continues to push himself and is highly motivated. He is disappointed that he has lost some of his physical functioning since having COVID. He currently exercises at 5.0 METs on the upright bike. Will continue to monitor and progess as able.      Expected Outcomes  Through exercise at rehab and at home, the patient will decrease shortness of breath with daily activities and feel confident in carrying out an exercise regime at home.  Through exercise at rehab and at home, the patient will decrease shortness of breath with daily activities and feel confident in carrying out an exercise regime at home.         Discharge Exercise Prescription (Final Exercise Prescription Changes): Exercise Prescription Changes - 08/25/19 1100      Response to Exercise   Blood Pressure (Admit)  114/80    Blood Pressure (Exercise)  140/80    Blood Pressure (Exit)  114/70    Heart Rate (Admit)  90 bpm    Heart Rate (Exercise)  141 bpm    Heart Rate (Exit)  100 bpm    Oxygen Saturation (Admit)  96 %  Oxygen Saturation (Exercise)  94 %    Oxygen Saturation (Exit)  95 %    Rating of Perceived Exertion (Exercise)  15    Perceived Dyspnea (Exercise)  3.5    Duration  Continue with 30 min of aerobic exercise without signs/symptoms of physical distress.    Intensity  THRR unchanged      Progression   Progression  Continue to progress workloads to maintain intensity without signs/symptoms of physical distress.      Resistance Training   Training Prescription  Yes    Weight  blue bands    Reps  10-15    Time  10 Minutes      Treadmill   MPH  3    Grade  3    Minutes  15      Bike   Level  35    Minutes  15    METs  5.1       Nutrition:  Target Goals: Understanding of nutrition guidelines, daily intake of sodium '1500mg'$ , cholesterol '200mg'$ , calories 30% from fat and 7% or less from saturated fats, daily to have 5 or more servings  of fruits and vegetables.  Biometrics: Pre Biometrics - 06/29/19 1307      Pre Biometrics   Height  6' (1.829 m)    Weight  191 lb 9.3 oz (86.9 kg)    BMI (Calculated)  25.98    Grip Strength  40 kg        Nutrition Therapy Plan and Nutrition Goals: Nutrition Therapy & Goals - 07/27/19 1011      Nutrition Therapy   Diet  Mediterranean      Personal Nutrition Goals   Nutrition Goal  Pt to build a healthy plate including vegetables, fruits, whole grains, and low-fat dairy products in a heart healthy meal plan.    Personal Goal #2  Pt to decrease alcohol intake to two 12 oz beers per day      Intervention Plan   Intervention  Prescribe, educate and counsel regarding individualized specific dietary modifications aiming towards targeted core components such as weight, hypertension, lipid management, diabetes, heart failure and other comorbidities.;Nutrition handout(s) given to patient.    Expected Outcomes  Long Term Goal: Adherence to prescribed nutrition plan.;Short Term Goal: A plan has been developed with personal nutrition goals set during dietitian appointment.       Nutrition Assessments: Nutrition Assessments - 07/07/19 1007      Rate Your Plate Scores   Pre Score  49       Nutrition Goals Re-Evaluation: Nutrition Goals Re-Evaluation    Row Name 07/27/19 1012 08/25/19 0930           Goals   Current Weight  192 lb (87.1 kg)  192 lb (87.1 kg)      Nutrition Goal  Pt to build a healthy plate including vegetables, fruits, whole grains, and low-fat dairy products in a heart healthy meal plan.  Pt to build a healthy plate including vegetables, fruits, whole grains, and low-fat dairy products in a heart healthy meal plan.      Expected Outcome  --  Decreased alcohol intake        Personal Goal #2 Re-Evaluation   Personal Goal #2  Pt to decrease alcohol intake to two 12 oz beers per day  Pt to decrease alcohol intake to two 12 oz beers per day         Nutrition Goals  Discharge (Final Nutrition Goals Re-Evaluation):  Nutrition Goals Re-Evaluation - 08/25/19 0930      Goals   Current Weight  192 lb (87.1 kg)    Nutrition Goal  Pt to build a healthy plate including vegetables, fruits, whole grains, and low-fat dairy products in a heart healthy meal plan.    Expected Outcome  Decreased alcohol intake      Personal Goal #2 Re-Evaluation   Personal Goal #2  Pt to decrease alcohol intake to two 12 oz beers per day       Psychosocial: Target Goals: Acknowledge presence or absence of significant depression and/or stress, maximize coping skills, provide positive support system. Participant is able to verbalize types and ability to use techniques and skills needed for reducing stress and depression.  Initial Review & Psychosocial Screening: Initial Psych Review & Screening - 06/29/19 1240      Initial Review   Current issues with  None Identified      Family Dynamics   Good Support System?  Yes      Barriers   Psychosocial barriers to participate in program  There are no identifiable barriers or psychosocial needs.      Screening Interventions   Interventions  Encouraged to exercise       Quality of Life Scores:  Scores of 19 and below usually indicate a poorer quality of life in these areas.  A difference of  2-3 points is a clinically meaningful difference.  A difference of 2-3 points in the total score of the Quality of Life Index has been associated with significant improvement in overall quality of life, self-image, physical symptoms, and general health in studies assessing change in quality of life.  PHQ-9: Recent Review Flowsheet Data    Depression screen [facility] 2/9 06/29/2019 04/10/2018 04/05/2017 04/02/2017 03/25/2017   Decreased Interest 0 0 0 0 0   Down, Depressed, Hopeless 0 0 0 0 0   PHQ - 2 Score 0 0 0 0 0   Altered sleeping 0 - - - -   Tired, decreased energy 0 - - - -   Change in appetite 0 - - - -   Feeling bad or failure about  yourself  0 - - - -   Trouble concentrating 0 - - - -   Moving slowly or fidgety/restless 0 - - - -   Suicidal thoughts 0 - - - -   Difficult doing work/chores Not difficult at all - - - -     Interpretation of Total Score  Total Score Depression Severity:  1-4 = Minimal depression, 5-9 = Mild depression, 10-14 = Moderate depression, 15-19 = Moderately severe depression, 20-27 = Severe depression   Psychosocial Evaluation and Intervention: Psychosocial Evaluation - 06/29/19 1242      Psychosocial Evaluation & Interventions   Interventions  Encouraged to exercise with the program and follow exercise prescription    Comments  No barriers to participation in pulmonary rehab    Expected Outcomes  Will continue to have no barriers to participation in pulmonary rehab.    Continue Psychosocial Services   No Follow up required       Psychosocial Re-Evaluation: Psychosocial Re-Evaluation    [place] Name 07/27/19 1249 08/24/19 1258           Psychosocial Re-Evaluation   Current issues with  None Identified  None Identified      Comments  No psychosocial concerns identified at this time.  No psychosocial concerns identified.      Expected Outcomes  That patient will continue to have no barriers or psychosocial concerns while in pulmonary rehab.  For patient to continue to have no barriers or psychosocial concerns while particpating in pulmonary rehab.      Interventions  Encouraged to attend Pulmonary Rehabilitation for the exercise  Encouraged to attend Pulmonary Rehabilitation for the exercise      Continue Psychosocial Services   No Follow up required  No Follow up required         Psychosocial Discharge (Final Psychosocial Re-Evaluation): Psychosocial Re-Evaluation - 08/24/19 1258      Psychosocial Re-Evaluation   Current issues with  None Identified    Comments  No psychosocial concerns identified.    Expected Outcomes  For patient to continue to have no barriers or psychosocial  concerns while particpating in pulmonary rehab.    Interventions  Encouraged to attend Pulmonary Rehabilitation for the exercise    Continue Psychosocial Services   No Follow up required       Education: Education Goals: Education classes will be provided on a weekly basis, covering required topics. Participant will state understanding/return demonstration of topics presented.  Learning Barriers/Preferences: Learning Barriers/Preferences - 06/29/19 1245      Learning Barriers/Preferences   Learning Barriers  None    Learning Preferences  Audio;Computer/Internet;Group Instruction;Individual Instruction;Pictoral;Skilled Demonstration;Verbal Instruction;Video;Written Material       Education Topics: Risk Factor Reduction:  -Group instruction that is supported by a PowerPoint presentation. Instructor discusses the definition of a risk factor, different risk factors for pulmonary disease, and how the heart and lungs work together.     PULMONARY REHAB OTHER RESPIRATORY from 08/20/2019 in [place]  Date  08/13/19  Educator  DF  Instruction Review Code  2- Demonstrated Understanding      Nutrition for Pulmonary Patient:  -Group instruction provided by PowerPoint slides, verbal discussion, and written materials to support subject matter. The instructor gives an explanation and review of healthy diet recommendations, which includes a discussion on weight management, recommendations for fruit and vegetable consumption, as well as protein, fluid, caffeine, fiber, sodium, sugar, and alcohol. Tips for eating when patients are short of breath are discussed.   PULMONARY REHAB OTHER RESPIRATORY from 08/20/2019 in [place]  Date  08/11/19  Educator  dietician  Instruction Review Code  -- [handout]      Pursed Lip Breathing:  -Group instruction that is supported by demonstration and informational handouts. Instructor discusses the  benefits of pursed lip and diaphragmatic breathing and detailed demonstration on how to preform both.     Oxygen Safety:  -Group instruction provided by PowerPoint, verbal discussion, and written material to support subject matter. There is an overview of "What is Oxygen" and "Why do we need it".  Instructor also reviews how to create a safe environment for oxygen use, the importance of using oxygen as prescribed, and the risks of noncompliance. There is a brief discussion on traveling with oxygen and resources the patient may utilize.   Oxygen Equipment:  -Group instruction provided by [facility] Staff utilizing handouts, written materials, and equipment demonstrations.   Signs and Symptoms:  -Group instruction provided by written material and verbal discussion to support subject matter. Warning signs and symptoms of infection, stroke, and heart attack are reviewed and when to call the physician/911 reinforced. Tips for preventing the spread of infection discussed.   Advanced Directives:  -Group instruction provided by verbal instruction and written material to support subject matter.  Instructor reviews Advanced Directive laws and proper instruction for filling out document.   Pulmonary Video:  -Group video education that reviews the importance of medication and oxygen compliance, exercise, good nutrition, pulmonary hygiene, and pursed lip and diaphragmatic breathing for the pulmonary patient.   Exercise for the Pulmonary Patient:  -Group instruction that is supported by a PowerPoint presentation. Instructor discusses benefits of exercise, core components of exercise, frequency, duration, and intensity of an exercise routine, importance of utilizing pulse oximetry during exercise, safety while exercising, and options of places to exercise outside of rehab.     Pulmonary Medications:  -Verbally interactive group education provided by instructor with focus on inhaled medications and  proper administration.   Anatomy and Physiology of the Respiratory System and Intimacy:  -Group instruction provided by PowerPoint, verbal discussion, and written material to support subject matter. Instructor reviews respiratory cycle and anatomical components of the respiratory system and their functions. Instructor also reviews differences in obstructive and restrictive respiratory diseases with examples of each. Intimacy, Sex, and Sexuality differences are reviewed with a discussion on how relationships can change when diagnosed with pulmonary disease. Common sexual concerns are reviewed.   PULMONARY REHAB OTHER RESPIRATORY from 08/20/2019 in [place]  Date  07/16/19  Educator  Handout      MD DAY -A group question and answer session with a medical doctor that allows participants to ask questions that relate to their pulmonary disease state.   OTHER EDUCATION -Group or individual verbal, written, or video instructions that support the educational goals of the pulmonary rehab program.   PULMONARY REHAB OTHER RESPIRATORY from 08/20/2019 in [place]  Date  08/20/19  Educator  DF [Met Level H/O]  Instruction Review Code  -- [na]      Holiday Eating Survival Tips:  -Group instruction provided by PowerPoint slides, verbal discussion, and written materials to support subject matter. The instructor gives patients tips, tricks, and techniques to help them not only survive but enjoy the holidays despite the onslaught of food that accompanies the holidays.   Knowledge Questionnaire Score: Knowledge Questionnaire Score - 06/29/19 1404      Knowledge Questionnaire Score   Pre Score  17/18       Core Components/Risk Factors/Patient Goals at Admission: Personal Goals and Risk Factors at Admission - 06/29/19 1245      Core Components/Risk Factors/Patient Goals on Admission   Improve shortness of breath with ADL's  Yes     Intervention  Provide education, individualized exercise plan and daily activity instruction to help decrease symptoms of SOB with activities of daily living.    Expected Outcomes  Short Term: Improve cardiorespiratory fitness to achieve a reduction of symptoms when performing ADLs;Long Term: Be able to perform more ADLs without symptoms or delay the onset of symptoms       Core Components/Risk Factors/Patient Goals Review:  Goals and Risk Factor Review    Row Name 06/29/19 1245 07/27/19 1305 08/24/19 1300         Core Components/Risk Factors/Patient Goals Review   Personal Goals Review  Develop more efficient breathing techniques such as purse lipped breathing and diaphragmatic breathing and practicing self-pacing with activity.;Increase knowledge of respiratory medications and ability to use respiratory devices properly.;Improve shortness of breath with ADL's  Develop more efficient breathing techniques such as purse lipped breathing and diaphragmatic breathing and practicing self-pacing with activity.;Increase knowledge of respiratory medications and ability to use respiratory devices properly.;Improve shortness  of breath with ADL's  Develop more efficient breathing techniques such as purse lipped breathing and diaphragmatic breathing and practicing self-pacing with activity.;Increase knowledge of respiratory medications and ability to use respiratory devices properly.;Improve shortness of breath with ADL's     Review  --  Benancio has attended 4 exercise sessions and is currently exercising on the treadmill @ 2.6 mph and a 2% incline, and level 2.5 on the sci-fit bicycle.  He enjoys talking and it is a challenge keeping him focused, but he is progressing well.  Raevon works very hard and at times over extends his physical capabilities and has to be cautioned to slow down.  Unfortunately COVID-19 has decreased his lung function by 20%.  He is walking on the treadmill @ 23mh/3% incline, and level 3.5 on  the Sci-fit bike.  He works hard, but is not happy withhis progress.     Expected Outcomes  --  see admission goals  See admission goals.        Core Components/Risk Factors/Patient Goals at Discharge (Final Review):  Goals and Risk Factor Review - 08/24/19 1300      Core Components/Risk Factors/Patient Goals Review   Personal Goals Review  Develop more efficient breathing techniques such as purse lipped breathing and diaphragmatic breathing and practicing self-pacing with activity.;Increase knowledge of respiratory medications and ability to use respiratory devices properly.;Improve shortness of breath with ADL's    Review  JDerrienworks very hard and at times over extends his physical capabilities and has to be cautioned to slow down.  Unfortunately COVID-19 has decreased his lung function by 20%.  He is walking on the treadmill @ 325m/3% incline, and level 3.5 on the Sci-fit bike.  He works hard, but is not happy withhis progress.    Expected Outcomes  See admission goals.       ITP Comments:   Comments: ITP REVIEW Pt is making expected progress toward pulmonary rehab goals after completing 13 sessions. Recommend continued exercise, life style modification, education, and utilization of breathing techniques to increase stamina and strength and decrease shortness of breath with exertion.

## 2019-08-27 ENCOUNTER — Encounter (HOSPITAL_COMMUNITY)
Admission: RE | Admit: 2019-08-27 | Discharge: 2019-08-27 | Disposition: A | Discharge status: Home / Self Care | Payer: 59 | Source: Ambulatory Visit | Attending: Pulmonary Disease | Admitting: Pulmonary Disease

## 2019-08-27 ENCOUNTER — Other Ambulatory Visit: Payer: Self-pay

## 2019-08-27 DIAGNOSIS — J8410 Pulmonary fibrosis, unspecified: Secondary | ICD-10-CM

## 2019-08-27 NOTE — Progress Notes (Signed)
Daily Session Note  Patient Details  Name: Frederick Williams MRN: [id number] Date of Birth: [birth year] Referring Provider:     Pulmonary Rehab Walk Test from 06/29/2019 in [place]  Referring Provider  Dr. Loanne Drilling      Encounter Date: 08/27/2019  Check In: Session Check In - 08/27/19 [place]      Check-In   Supervising physician immediately available to respond to emergencies  [facility] immediately available    Physician(s)  Dr. Dessa Phi    Location  [facility]    Staff Present  Rosebud Poles, RN, Bjorn Loser, MS, Exercise Physiologist;Shawnelle Spoerl Ysidro Evert, RN    Virtual Visit  No    Medication changes reported      No    Fall or balance concerns reported     No    Tobacco Cessation  No Change    Warm-up and Cool-down  Performed as group-led instruction    Resistance Training Performed  Yes    VAD Patient?  No    PAD/SET Patient?  No      Pain Assessment   Currently in Pain?  No/denies    Multiple Pain Sites  No       Capillary Blood Glucose: No results found for this or any previous visit (from the past 24 hour(s)).    Social History   Tobacco Use  Smoking Status Former Smoker  . Packs/day: 2.00  . Years: 32.00  . Pack years: 64.00  . Types: Cigarettes  . Quit date: 2013  . Years since quitting: 8.3  Smokeless Tobacco Former Systems developer  . Quit date: 02/02/2011    Goals Met:  Exercise tolerated well No report of cardiac concerns or symptoms Strength training completed today  Goals Unmet:  Not Applicable  Comments: Service time is from 1007 to 1117    Dr. Fransico Him is Medical Director for Cardiac Rehab at [facility].

## 2019-08-31 ENCOUNTER — Encounter: Payer: Self-pay | Admitting: Pulmonary Disease

## 2019-08-31 ENCOUNTER — Other Ambulatory Visit: Payer: Self-pay

## 2019-08-31 ENCOUNTER — Ambulatory Visit (INDEPENDENT_AMBULATORY_CARE_PROVIDER_SITE_OTHER): Payer: 59 | Admitting: Pulmonary Disease

## 2019-08-31 VITALS — BP 116/64 | HR 77 | Temp 98.8°F | Ht 72.0 in | Wt 191.4 lb

## 2019-08-31 DIAGNOSIS — J1282 Pneumonia due to coronavirus disease 2019: Secondary | ICD-10-CM | POA: Diagnosis not present

## 2019-08-31 DIAGNOSIS — U071 COVID-19: Secondary | ICD-10-CM

## 2019-08-31 DIAGNOSIS — J8410 Pulmonary fibrosis, unspecified: Secondary | ICD-10-CM

## 2019-08-31 NOTE — Patient Instructions (Signed)
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

## 2019-08-31 NOTE — Progress Notes (Signed)
Subjective:   PATIENT ID: Frederick Williams GENDER: male DOB: [birth year], MRN: [id number]   HPI  Chief Complaint  Patient presents with  . Follow-up    SOB not getting any better "lung pain" all time    Reason for Visit: Follow-up for shortness of breath  Frederick Williams is a 59 year old male active smoker with  history of COVID-19 pneumonia who presents for follow-up.  Synopsis: Initially diagnosed with COVID-19 pneumonia on 03/16/2019 and required hospitalization from 11/17-11/22.  He was treated with IV dexamethasone and remdesivir.  At discharge he did not require home oxygen. Has tried to return to work however severely limited in walking short distances. Unable to perform his duties. 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 He has attempted to return to work and unsuccessful due to his physical limitations.  07/14/19 Since our last visit, he was started on steroid taper and ICS/LABA (Advair). After completing the steroids, he reports his lung pain improved. However, he has not picked up the inhaler due to pharmacy communication. He reports recently working near [employer] and reports dizziness. He checked his O2 and it was 84% and took one minute to recover. He is currently participating in Pulmonary Rehab and at home he is walking more often. States that Pulmonary rehab is not challenging but then also reports being completely out of breath during certain exercises.  08/31/19 Since his last visit, he was started on Advair and has been taking only ONE puff once a day. Reports he limits inhaler due to burning sensation. He has been compliant with Pulmonary Rehab and meeting expected goals per notes. Prior to this vist we had discussed his improving CT Chest scan however he continues to have dyspnea on minimal exertion. The respiratory limitations he has had has prevented him from performing his prior work duties that he was able to  perform before his COVID-19 pneumonia diagnosis. No wheezing or cough.  Social History: Former smoker x 32 years 1-2ppd. Stopped smoking cigarettes but was vaping 7 years. Quit vaping this year.  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  Past Medical History:  Diagnosis Date  . Pneumonia due to COVID-19 virus     Outpatient Medications Prior to Visit  Medication Sig Dispense Refill  . albuterol (VENTOLIN HFA) 108 (90 Base) MCG/ACT inhaler Inhale 1-2 puffs into the lungs every 2 (two) hours as needed for wheezing or shortness of breath. 18 g 1  . fluticasone-salmeterol (ADVAIR HFA) 230-21 MCG/ACT inhaler Inhale 2 puffs into the lungs 2 (two) times daily. 1 Inhaler 12  . ipratropium-albuterol (DUONEB) 0.5-2.5 (3) MG/3ML SOLN Take 3 mLs by nebulization every 6 (six) hours as needed. 360 mL 3   No facility-administered medications prior to visit.    Review of Systems  Constitutional: Negative for chills, diaphoresis, fever, malaise/fatigue and weight loss.  HENT: Negative for congestion.   Respiratory: Positive for shortness of breath. Negative for cough, hemoptysis, sputum production and wheezing.   Cardiovascular: Negative for chest pain, palpitations and leg swelling.    Objective:   Vitals:   08/31/19 1110  BP: 116/64  Pulse: 77  Temp: 98.8 F (37.1 C)  TempSrc: Temporal  SpO2: 98%  Weight: 191 lb 6.4 oz (86.8 kg)  Height: 6' (1.829 m)   SpO2: 98 % O2 Device: None (Room air)  Physical Exam: General: Well-appearing, no acute distress HENT: [place], AT Eyes: EOMI, no scleral icterus Respiratory: Clear to auscultation bilaterally.  No crackles, wheezing or rales Cardiovascular: RRR, -M/R/G, no JVD Extremities:-Edema,-tenderness Neuro: AAO x4, CNII-XII grossly intact Skin: Intact, no rashes or bruising Psych: Normal  mood, normal affect  Data Reviewed:  Imaging: CXR 04/07/19 - Resolved bilateral opacities compared to CXRs in 03/2019 CT Chest 03/10/20 - Mild bilateral patchy GGO  CT Chest 08/20/19 - Resolution/improvement of bilateral ground glass opacities  PFT: 06/18/19 FVC 3.19 (61%) FEV1 2.8 (71%) Ratio 85  TLC 73% DLCO 68% Interpretation: Mild restrictive defect with reduced gas exchange    Assessment & Plan:   Discussion: 59 year old male with former tobacco/vaping history who presents for follow-up for 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  Since his covid-19 pneumonia diagnosis, he has been severely limited in performing regular activities including his former work duties. 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  CT Lung Screen - discuss at next visit  Orders Placed This Encounter  Procedures  . Pulmonary function test    Standing Status:   Future    Number of Occurrences:   1    Standing Expiration Date:   08/30/2020    Scheduling Instructions:     ASAP    Order Specific Question:   Where should this test be performed?    Answer:   [facility]    Order Specific Question:   Full PFT: includes the following: basic spirometry, spirometry pre & post bronchodilator, diffusion capacity (DLCO), lung volumes    Answer:   Full PFT   No orders of the defined types were placed in this encounter.  Return for follow-up after PFTs with NP.   Raylene Carmickle Mechele Collin, MD [facility] 08/31/2019 12:08 PM  Office Number [phone]

## 2019-09-01 ENCOUNTER — Ambulatory Visit: Payer: 59 | Admitting: Pulmonary Disease

## 2019-09-01 ENCOUNTER — Encounter (HOSPITAL_COMMUNITY)
Admission: RE | Admit: 2019-09-01 | Discharge: 2019-09-01 | Disposition: A | Discharge status: Home / Self Care | Payer: 59 | Source: Ambulatory Visit | Attending: Pulmonary Disease | Admitting: Pulmonary Disease

## 2019-09-01 ENCOUNTER — Ambulatory Visit: Payer: 59

## 2019-09-01 ENCOUNTER — Other Ambulatory Visit: Payer: Self-pay

## 2019-09-01 DIAGNOSIS — J8410 Pulmonary fibrosis, unspecified: Secondary | ICD-10-CM | POA: Diagnosis not present

## 2019-09-01 NOTE — Progress Notes (Signed)
Daily Session Note  Patient Details  Name: Frederick Williams MRN: [id number] Date of Birth: 02-21-1961 Referring Provider:     Pulmonary Rehab Walk Test from 06/29/2019 in [place]  Referring Provider  Dr. Loanne Drilling      Encounter Date: 09/01/2019  Check In: Session Check In - 09/01/19 1153      Check-In   Supervising physician immediately available to respond to emergencies  [facility] immediately available    Physician(s)  Dr. Dessa Phi    Location  [facility]    Staff Present  Rosebud Poles, RN, Bjorn Loser, MS, Exercise Physiologist;Lisa Ysidro Evert, RN    Virtual Visit  No    Medication changes reported      No    Fall or balance concerns reported     No    Tobacco Cessation  No Change    Warm-up and Cool-down  Performed as group-led instruction    Resistance Training Performed  Yes    VAD Patient?  No    PAD/SET Patient?  No      Pain Assessment   Currently in Pain?  No/denies    Multiple Pain Sites  No       Capillary Blood Glucose: No results found for this or any previous visit (from the past 24 hour(s)).    Social History   Tobacco Use  Smoking Status Former Smoker  . Packs/day: 2.00  . Years: 32.00  . Pack years: 64.00  . Types: Cigarettes  . Quit date: 2013  . Years since quitting: 8.3  Smokeless Tobacco Former Systems developer  . Quit date: 02/02/2011    Goals Met:  Independence with exercise equipment Exercise tolerated well Strength training completed today  Goals Unmet:  Not Applicable  Comments: Service time is from 1000 to 1100    Dr. Fransico Him is Medical Director for Cardiac Rehab at [facility].

## 2019-09-02 NOTE — Telephone Encounter (Signed)
Dr Everardo All, please advise  Hello Dr. Everardo All, I was wondering if it's a possibility that you could give me a phone call when you have time I Felt that the end of my visit with you this past Monday was a little confusing to me My phone number is [phone]

## 2019-09-03 ENCOUNTER — Encounter (HOSPITAL_COMMUNITY)
Admission: RE | Admit: 2019-09-03 | Discharge: 2019-09-03 | Disposition: A | Discharge status: Home / Self Care | Payer: 59 | Source: Ambulatory Visit | Attending: Pulmonary Disease | Admitting: Pulmonary Disease

## 2019-09-03 ENCOUNTER — Other Ambulatory Visit: Payer: Self-pay

## 2019-09-03 DIAGNOSIS — J8410 Pulmonary fibrosis, unspecified: Secondary | ICD-10-CM | POA: Diagnosis not present

## 2019-09-03 NOTE — Progress Notes (Signed)
Daily Session Note  Patient Details  Name: Frederick Williams MRN: [id number] Date of Birth: March 03, 1961 Referring Provider:     Pulmonary Rehab Walk Test from 06/29/2019 in [place]  Referring Provider  Dr. Loanne Drilling      Encounter Date: 09/03/2019  Check In: Session Check In - 09/03/19 1026      Check-In   Supervising physician immediately available to respond to emergencies  [facility] immediately available    Physician(s)  Dr. Philis Pique    Location  [facility]    Staff Present  Rosebud Poles, RN, Bjorn Loser, MS, Exercise Physiologist;Lisa Ysidro Evert, RN    Virtual Visit  No    Medication changes reported      No    Fall or balance concerns reported     No    Tobacco Cessation  No Change    Warm-up and Cool-down  Performed as group-led instruction    Resistance Training Performed  Yes    VAD Patient?  No    PAD/SET Patient?  No      Pain Assessment   Currently in Pain?  No/denies    Multiple Pain Sites  No       Capillary Blood Glucose: No results found for this or any previous visit (from the past 24 hour(s)).    Social History   Tobacco Use  Smoking Status Former Smoker  . Packs/day: 2.00  . Years: 32.00  . Pack years: 64.00  . Types: Cigarettes  . Quit date: 2013  . Years since quitting: 8.3  Smokeless Tobacco Former Systems developer  . Quit date: 02/02/2011    Goals Met:  Independence with exercise equipment Exercise tolerated well Strength training completed today  Goals Unmet:  Not Applicable  Comments: Service time is from 0945 to 1100    Dr. Fransico Him is Medical Director for Cardiac Rehab at [facility].

## 2019-09-04 NOTE — Telephone Encounter (Signed)
Pt has left a voice mail message on my phone & is requesting to hear something before Dr. Everardo All goes home for the weekend.  Pt states he has already left 2 messages.

## 2019-09-10 NOTE — Progress Notes (Signed)
Discharge Progress Report  Patient Details  Name: Frederick Williams MRN: [id number] Date of Birth: August 20, 1960 Referring Provider:     Pulmonary Rehab Walk Test from 06/29/2019 in [place]  Referring Provider  Dr. Loanne Drilling       Number of Visits: 16  Reason for Discharge:  Patient reached a stable level of exercise. Patient independent in their exercise. Patient has met program and personal goals.  Smoking History:  Social History   Tobacco Use  Smoking Status Former Smoker  . Packs/day: 2.00  . Years: 32.00  . Pack years: 64.00  . Types: Cigarettes  . Quit date: 2013  . Years since quitting: 8.3  Smokeless Tobacco Former Systems developer  . Quit date: 02/02/2011    Diagnosis:  Lung fibrosis [facility])  ADL UCSD: Pulmonary Assessment Scores    Row Name 06/29/19 1403 09/03/19 1557       ADL UCSD   ADL Phase  Entry  Exit    SOB Score total  16  56      CAT Score   CAT Score  16  18      mMRC Score   mMRC Score  --  0       Initial Exercise Prescription: Initial Exercise Prescription - 06/29/19 1300      Date of Initial Exercise RX and Referring Provider   Date  06/29/19    Referring Provider  Dr. Loanne Drilling      Treadmill   MPH  2    Grade  1    Minutes  15      Bike   Level  2    Minutes  15      Prescription Details   Frequency (times per week)  2    Duration  Progress to 30 minutes of continuous aerobic without signs/symptoms of physical distress      Intensity   THRR 40-80% of Max Heartrate  65-130    Ratings of Perceived Exertion  11-13    Perceived Dyspnea  0-4      Progression   Progression  Continue to progress workloads to maintain intensity without signs/symptoms of physical distress.      Resistance Training   Training Prescription  Yes    Weight  blue bands    Reps  10-15       Discharge Exercise Prescription (Final Exercise Prescription Changes): Exercise Prescription Changes - 08/25/19 1100      Response to  Exercise   Blood Pressure (Admit)  114/80    Blood Pressure (Exercise)  140/80    Blood Pressure (Exit)  114/70    Heart Rate (Admit)  90 bpm    Heart Rate (Exercise)  141 bpm    Heart Rate (Exit)  100 bpm    Oxygen Saturation (Admit)  96 %    Oxygen Saturation (Exercise)  94 %    Oxygen Saturation (Exit)  95 %    Rating of Perceived Exertion (Exercise)  15    Perceived Dyspnea (Exercise)  3.5    Duration  Continue with 30 min of aerobic exercise without signs/symptoms of physical distress.    Intensity  THRR unchanged      Progression   Progression  Continue to progress workloads to maintain intensity without signs/symptoms of physical distress.      Resistance Training   Training Prescription  Yes    Weight  blue bands    Reps  10-15    Time  10 Minutes      Treadmill   MPH  3    Grade  3    Minutes  15      Bike   Level  35    Minutes  15    METs  5.1       Functional Capacity: 6 Minute Walk    Row Name 06/29/19 1328 09/03/19 1558       6 Minute Walk   Phase  Initial  Discharge    Distance  1470 feet  1700 feet    Distance % Change  --  15.65 %    Distance Feet Change  --  230 ft    Walk Time  6 minutes  6 minutes    # of Rest Breaks  0  0    MPH  2.78  3.22    METS  4.48  4.73    RPE  12  11    Perceived Dyspnea   3  2    VO2 Peak  15.69  16.57    Symptoms  No  No    Resting HR  98 bpm  108 bpm    Resting BP  128/84  140/80    Resting Oxygen Saturation   95 %  96 %    Exercise Oxygen Saturation  during 6 min walk  94 %  94 %    Max Ex. HR  126 bpm  117 bpm    Max Ex. BP  152/80  144/76    2 Minute Post BP  124/86  110/80      Interval HR   1 Minute HR  108  111    2 Minute HR  111  115    3 Minute HR  120  114    4 Minute HR  126  118    5 Minute HR  107  117    6 Minute HR  108  115    2 Minute Post HR  96  114    Interval Heart Rate?  Yes  Yes      Interval Oxygen   Interval Oxygen?  Yes  Yes    Baseline Oxygen Saturation %  95 %  96 %     1 Minute Oxygen Saturation %  95 %  94 %    1 Minute Liters of Oxygen  0 L  0 L    2 Minute Oxygen Saturation %  94 %  97 %    2 Minute Liters of Oxygen  0 L  0 L    3 Minute Oxygen Saturation %  95 %  96 %    3 Minute Liters of Oxygen  0 L  0 L    4 Minute Oxygen Saturation %  95 %  97 %    4 Minute Liters of Oxygen  0 L  0 L    5 Minute Oxygen Saturation %  95 %  97 %    5 Minute Liters of Oxygen  0 L  0 L    6 Minute Oxygen Saturation %  96 %  97 %    6 Minute Liters of Oxygen  0 L  0 L    2 Minute Post Oxygen Saturation %  95 %  95 %    2 Minute Post Liters of Oxygen  0 L  0 L       Psychological, QOL, Others - Outcomes: PHQ 2/9: Depression  screen [facility] 2/9 06/29/2019 04/10/2018 04/05/2017 04/02/2017 03/25/2017  Decreased Interest 0 0 0 0 0  Down, Depressed, Hopeless 0 0 0 0 0  PHQ - 2 Score 0 0 0 0 0  Altered sleeping 0 - - - -  Tired, decreased energy 0 - - - -  Change in appetite 0 - - - -  Feeling bad or failure about yourself  0 - - - -  Trouble concentrating 0 - - - -  Moving slowly or fidgety/restless 0 - - - -  Suicidal thoughts 0 - - - -  Difficult doing work/chores Not difficult at all - - - -    Quality of Life:   Personal Goals: Goals established at orientation with interventions provided to work toward goal. Personal Goals and Risk Factors at Admission - 06/29/19 1245      Core Components/Risk Factors/Patient Goals on Admission   Improve shortness of breath with ADL's  Yes    Intervention  Provide education, individualized exercise plan and daily activity instruction to help decrease symptoms of SOB with activities of daily living.    Expected Outcomes  Short Term: Improve cardiorespiratory fitness to achieve a reduction of symptoms when performing ADLs;Long Term: Be able to perform more ADLs without symptoms or delay the onset of symptoms        Personal Goals Discharge: Goals and Risk Factor Review    Row Name 06/29/19 1245 07/27/19 1305 08/24/19  1300         Core Components/Risk Factors/Patient Goals Review   Personal Goals Review  Develop more efficient breathing techniques such as purse lipped breathing and diaphragmatic breathing and practicing self-pacing with activity.;Increase knowledge of respiratory medications and ability to use respiratory devices properly.;Improve shortness of breath with ADL's  Develop more efficient breathing techniques such as purse lipped breathing and diaphragmatic breathing and practicing self-pacing with activity.;Increase knowledge of respiratory medications and ability to use respiratory devices properly.;Improve shortness of breath with ADL's  Develop more efficient breathing techniques such as purse lipped breathing and diaphragmatic breathing and practicing self-pacing with activity.;Increase knowledge of respiratory medications and ability to use respiratory devices properly.;Improve shortness of breath with ADL's     Review  --  Frederick Williams has attended 4 exercise sessions and is currently exercising on the treadmill @ 2.6 mph and a 2% incline, and level 2.5 on the sci-fit bicycle.  He enjoys talking and it is a challenge keeping him focused, but he is progressing well.  Frederick Williams works very hard and at times over extends his physical capabilities and has to be cautioned to slow down.  Unfortunately COVID-19 has decreased his lung function by 20%.  He is walking on the treadmill @ 47mh/3% incline, and level 3.5 on the Sci-fit bike.  He works hard, but is not happy withhis progress.     Expected Outcomes  --  see admission goals  See admission goals.        Exercise Goals and Review: Exercise Goals    Row Name 06/29/19 1333 07/28/19 0707 08/25/19 0735         Exercise Goals   Increase Physical Activity  Yes  Yes  Yes     Intervention  Provide advice, education, support and counseling about physical activity/exercise needs.;Develop an individualized exercise prescription for aerobic and resistive training  based on initial evaluation findings, risk stratification, comorbidities and participant's personal goals.  Provide advice, education, support and counseling about physical activity/exercise needs.;Develop an individualized exercise prescription for aerobic  and resistive training based on initial evaluation findings, risk stratification, comorbidities and participant's personal goals.  Provide advice, education, support and counseling about physical activity/exercise needs.;Develop an individualized exercise prescription for aerobic and resistive training based on initial evaluation findings, risk stratification, comorbidities and participant's personal goals.     Expected Outcomes  Short Term: Attend rehab on a regular basis to increase amount of physical activity.;Long Term: Add in home exercise to make exercise part of routine and to increase amount of physical activity.;Long Term: Exercising regularly at least 3-5 days a week.  Short Term: Attend rehab on a regular basis to increase amount of physical activity.;Long Term: Add in home exercise to make exercise part of routine and to increase amount of physical activity.;Long Term: Exercising regularly at least 3-5 days a week.  Short Term: Attend rehab on a regular basis to increase amount of physical activity.;Long Term: Add in home exercise to make exercise part of routine and to increase amount of physical activity.;Long Term: Exercising regularly at least 3-5 days a week.     Increase Strength and Stamina  Yes  Yes  Yes     Intervention  Provide advice, education, support and counseling about physical activity/exercise needs.;Develop an individualized exercise prescription for aerobic and resistive training based on initial evaluation findings, risk stratification, comorbidities and participant's personal goals.  Provide advice, education, support and counseling about physical activity/exercise needs.;Develop an individualized exercise prescription for  aerobic and resistive training based on initial evaluation findings, risk stratification, comorbidities and participant's personal goals.  Provide advice, education, support and counseling about physical activity/exercise needs.;Develop an individualized exercise prescription for aerobic and resistive training based on initial evaluation findings, risk stratification, comorbidities and participant's personal goals.     Expected Outcomes  Short Term: Increase workloads from initial exercise prescription for resistance, speed, and METs.;Short Term: Perform resistance training exercises routinely during rehab and add in resistance training at home;Long Term: Improve cardiorespiratory fitness, muscular endurance and strength as measured by increased METs and functional capacity (6MWT)  Short Term: Increase workloads from initial exercise prescription for resistance, speed, and METs.;Short Term: Perform resistance training exercises routinely during rehab and add in resistance training at home;Long Term: Improve cardiorespiratory fitness, muscular endurance and strength as measured by increased METs and functional capacity (6MWT)  Short Term: Increase workloads from initial exercise prescription for resistance, speed, and METs.;Short Term: Perform resistance training exercises routinely during rehab and add in resistance training at home;Long Term: Improve cardiorespiratory fitness, muscular endurance and strength as measured by increased METs and functional capacity (6MWT)     Able to understand and use rate of perceived exertion (RPE) scale  Yes  Yes  Yes     Intervention  Provide education and explanation on how to use RPE scale  Provide education and explanation on how to use RPE scale  Provide education and explanation on how to use RPE scale     Expected Outcomes  Short Term: Able to use RPE daily in rehab to express subjective intensity level;Long Term:  Able to use RPE to guide intensity level when exercising  independently  Short Term: Able to use RPE daily in rehab to express subjective intensity level;Long Term:  Able to use RPE to guide intensity level when exercising independently  Short Term: Able to use RPE daily in rehab to express subjective intensity level;Long Term:  Able to use RPE to guide intensity level when exercising independently     Able to understand and use Dyspnea scale  Yes  Yes  Yes     Intervention  Provide education and explanation on how to use Dyspnea scale  Provide education and explanation on how to use Dyspnea scale  Provide education and explanation on how to use Dyspnea scale     Expected Outcomes  Short Term: Able to use Dyspnea scale daily in rehab to express subjective sense of shortness of breath during exertion;Long Term: Able to use Dyspnea scale to guide intensity level when exercising independently  Short Term: Able to use Dyspnea scale daily in rehab to express subjective sense of shortness of breath during exertion;Long Term: Able to use Dyspnea scale to guide intensity level when exercising independently  Short Term: Able to use Dyspnea scale daily in rehab to express subjective sense of shortness of breath during exertion;Long Term: Able to use Dyspnea scale to guide intensity level when exercising independently     Knowledge and understanding of Target Heart Rate Range (THRR)  Yes  Yes  Yes     Intervention  Provide education and explanation of THRR including how the numbers were predicted and where they are located for reference  --  Provide education and explanation of THRR including how the numbers were predicted and where they are located for reference     Expected Outcomes  Short Term: Able to state/look up THRR;Short Term: Able to use daily as guideline for intensity in rehab;Long Term: Able to use THRR to govern intensity when exercising independently  Short Term: Able to state/look up THRR;Short Term: Able to use daily as guideline for intensity in rehab;Long  Term: Able to use THRR to govern intensity when exercising independently  Short Term: Able to state/look up THRR;Short Term: Able to use daily as guideline for intensity in rehab;Long Term: Able to use THRR to govern intensity when exercising independently     Understanding of Exercise Prescription  Yes  Yes  Yes     Intervention  Provide education, explanation, and written materials on patient's individual exercise prescription  Provide education, explanation, and written materials on patient's individual exercise prescription  Provide education, explanation, and written materials on patient's individual exercise prescription     Expected Outcomes  Short Term: Able to explain program exercise prescription;Long Term: Able to explain home exercise prescription to exercise independently  Short Term: Able to explain program exercise prescription;Long Term: Able to explain home exercise prescription to exercise independently  Short Term: Able to explain program exercise prescription;Long Term: Able to explain home exercise prescription to exercise independently        Exercise Goals Re-Evaluation: Exercise Goals Re-Evaluation    Row Name 07/28/19 0707 08/25/19 0736           Exercise Goal Re-Evaluation   Exercise Goals Review  Increase Physical Activity;Increase Strength and Stamina;Able to understand and use rate of perceived exertion (RPE) scale;Able to understand and use Dyspnea scale;Knowledge and understanding of Target Heart Rate Range (THRR);Understanding of Exercise Prescription  Increase Physical Activity;Increase Strength and Stamina;Able to understand and use rate of perceived exertion (RPE) scale;Able to understand and use Dyspnea scale;Knowledge and understanding of Target Heart Rate Range (THRR);Understanding of Exercise Prescription      Comments  Pt has attended 4 exercise sessions. We are pushing him hard so he will be able to return to work. Pt currently exercises at 3.0 METs on the  upright bike. Will continue to monitor and progress as able.  Pt has attended 12 exercise sessions. Pt continues to push himself and is  highly motivated. He is disappointed that he has lost some of his physical functioning since having COVID. He currently exercises at 5.0 METs on the upright bike. Will continue to monitor and progess as able.      Expected Outcomes  Through exercise at rehab and at home, the patient will decrease shortness of breath with daily activities and feel confident in carrying out an exercise regime at home.  Through exercise at rehab and at home, the patient will decrease shortness of breath with daily activities and feel confident in carrying out an exercise regime at home.         Nutrition & Weight - Outcomes: Pre Biometrics - 06/29/19 1307      Pre Biometrics   Height  6' (1.829 m)    Weight  86.9 kg    BMI (Calculated)  25.98    Grip Strength  40 kg        Nutrition: Nutrition Therapy & Goals - 07/27/19 1011      Nutrition Therapy   Diet  Mediterranean      Personal Nutrition Goals   Nutrition Goal  Pt to build a healthy plate including vegetables, fruits, whole grains, and low-fat dairy products in a heart healthy meal plan.    Personal Goal #2  Pt to decrease alcohol intake to two 12 oz beers per day      Intervention Plan   Intervention  Prescribe, educate and counsel regarding individualized specific dietary modifications aiming towards targeted core components such as weight, hypertension, lipid management, diabetes, heart failure and other comorbidities.;Nutrition handout(s) given to patient.    Expected Outcomes  Long Term Goal: Adherence to prescribed nutrition plan.;Short Term Goal: A plan has been developed with personal nutrition goals set during dietitian appointment.       Nutrition Discharge: Nutrition Assessments - 09/10/19 1439      Rate Your Plate Scores   Post Score  58       Education Questionnaire Score: Knowledge  Questionnaire Score - 09/03/19 1557      Knowledge Questionnaire Score   Post Score  13/18       Goals reviewed with patient; copy given to patient.

## 2019-10-06 ENCOUNTER — Other Ambulatory Visit (HOSPITAL_COMMUNITY)
Admission: RE | Admit: 2019-10-06 | Discharge: 2019-10-06 | Disposition: A | Discharge status: Home / Self Care | Payer: 59 | Source: Ambulatory Visit | Attending: Adult Health | Admitting: Adult Health

## 2019-10-06 DIAGNOSIS — Z20822 Contact with and (suspected) exposure to covid-19: Secondary | ICD-10-CM | POA: Insufficient documentation

## 2019-10-06 DIAGNOSIS — Z01812 Encounter for preprocedural laboratory examination: Secondary | ICD-10-CM | POA: Insufficient documentation

## 2019-10-06 LAB — SARS CORONAVIRUS 2 (TAT 6-24 HRS): SARS Coronavirus 2: NEGATIVE

## 2019-10-09 ENCOUNTER — Ambulatory Visit (INDEPENDENT_AMBULATORY_CARE_PROVIDER_SITE_OTHER): Payer: 59 | Admitting: Pulmonary Disease

## 2019-10-09 ENCOUNTER — Ambulatory Visit (INDEPENDENT_AMBULATORY_CARE_PROVIDER_SITE_OTHER): Payer: 59 | Admitting: Adult Health

## 2019-10-09 ENCOUNTER — Encounter: Payer: Self-pay | Admitting: *Deleted

## 2019-10-09 ENCOUNTER — Other Ambulatory Visit: Payer: Self-pay

## 2019-10-09 DIAGNOSIS — J1282 Pneumonia due to coronavirus disease 2019: Secondary | ICD-10-CM | POA: Diagnosis not present

## 2019-10-09 DIAGNOSIS — U071 COVID-19: Secondary | ICD-10-CM

## 2019-10-09 DIAGNOSIS — J8410 Pulmonary fibrosis, unspecified: Secondary | ICD-10-CM

## 2019-10-09 DIAGNOSIS — Z8616 Personal history of COVID-19: Secondary | ICD-10-CM

## 2019-10-09 LAB — PULMONARY FUNCTION TEST
DL/VA % pred: 103 %
DL/VA: 4.38 ml/min/mmHg/L
DLCO unc % pred: 75 %
DLCO unc: 22.53 ml/min/mmHg
FEF 25-75 Post: 3.48 L/s
FEF 25-75 Pre: 2.89 L/s
FEF2575-%Change-Post: 20 %
FEF2575-%Pred-Post: 106 %
FEF2575-%Pred-Pre: 88 %
FEV1-%Change-Post: 2 %
FEV1-%Pred-Post: 74 %
FEV1-%Pred-Pre: 72 %
FEV1-Post: 2.93 L
FEV1-Pre: 2.85 L
FEV1FVC-%Change-Post: 5 %
FEV1FVC-%Pred-Pre: 106 %
FEV6-%Change-Post: -4 %
FEV6-%Pred-Post: 68 %
FEV6-%Pred-Pre: 71 %
FEV6-Post: 3.39 L
FEV6-Pre: 3.53 L
FEV6FVC-%Pred-Post: 104 %
FEV6FVC-%Pred-Pre: 104 %
FVC-%Change-Post: -2 %
FVC-%Pred-Post: 66 %
FVC-%Pred-Pre: 68 %
FVC-Post: 3.43 L
FVC-Pre: 3.53 L
Post FEV1/FVC ratio: 85 %
Post FEV6/FVC ratio: 100 %
Pre FEV1/FVC ratio: 81 %
Pre FEV6/FVC Ratio: 100 %
RV % pred: 101 %
RV: 2.34 L
TLC % pred: 78 %
TLC: 5.79 L

## 2019-10-09 NOTE — Assessment & Plan Note (Addendum)
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  He has associated chronic dyspnea.  Lung function does show very slight improvement today.  Most recent high-resolution CT chest also showed slight improvement.  However patient continues to have ongoing symptoms.  Have advised him to increase his activities as tolerated. Continue on Advair.  Plan  Patient Instructions  Continue on Advair Twice daily , rinse after use .  Activity as tolerated.  Follow up with Dr. Everardo All in 3 months and As needed

## 2019-10-09 NOTE — Patient Instructions (Signed)
Continue on Advair Twice daily , rinse after use .  Activity as tolerated.  Follow up with Dr. Everardo All in 3 months and As needed

## 2019-10-09 NOTE — Progress Notes (Signed)
PFT completed today.  

## 2019-10-09 NOTE — Progress Notes (Signed)
@Patient  ID: , male    DOB: November 02, 1960, 59 y.o.   MRN: [id number]  Chief Complaint  Patient presents with  . Follow-up    dyspnea     Referring provider: [id number], FNP  HPI: 59 year old male former smoker followed for ongoing shortness of breath and abnormal CT chest after COVID-19 infection and COVID-19 pneumonia in November 2020 Hospitalized with COVID-26 March 2019 treated with Decadron and remdesivir.  TEST/EVENTS :  High-resolution CT chest August 20, 2019 shows persistent faint patchy groundglass opacities in both lungs predominantly to the upper lungs which have decreased slightly since May 21, 2019.  No traction bronchiectasis or frank honeycombing.  Consistent with a mild postinflammatory fibrosis most likely due to COVID-19  PFT June 18, 2019 showed FEV1 at 71%, ratio 88, FVC 61%, no significant bronchodilator response, normal mid flows, DLCO 68%.  Social History: Former smoker x 32 years 1-2ppd. Stopped smoking cigarettes but was vaping 7 years. Quit vaping this year.  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  Patient is being followed for ongoing shortness of breath since COVID-19 and COVID-19 pneumonia in November 2020.  He has known postinflammatory fibrosis on CT chest after his COVID-19 pneumonia.  Patient was set up for pulmonary function test today with FEV1 at 72%, ratio 81, FVC 68%, DLCO 75%.  Lung function has improved since previous testing in February 2021. Patient says he is slightly better over the last couple months but continues to be very short of breath with minimum activity.  He wears out easily and has low activity tolerance.  He has finished pulmonary rehab which he feels it did help.  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  Patient says he can do light walking and chores but still has to rest even with that.  He denies any increased cough.  O2 sats are staying above 90% on room air.   Wants to get Covid ab test .  We discussed COVID-19 vaccine.  Patient is still deciding if he is going to get this.  Patient education was given  Patient is very concerned about him not being able to work going forward.  And also concerned about losing his healthcare insurance due to losing his job  Allergies  Allergen Reactions  . Ciprofloxacin     Tendon pain  . Prednisone Other (See Comments)    Severe abd cramping  . Penicillins Rash     There is no immunization history on file for this patient.  Past Medical History:  Diagnosis Date  . Pneumonia due to COVID-19 virus     Tobacco History: Social History   Tobacco Use  Smoking Status Former Smoker  . Packs/day: 2.00  . Years: 32.00  . Pack years: 64.00  . Types: Cigarettes  . Quit date: 2013  . Years since quitting: 8.4  Smokeless Tobacco Former 2014  . Quit date: 02/02/2011   Counseling given: Not Answered   Outpatient Medications Prior to Visit  Medication Sig Dispense Refill  . albuterol (VENTOLIN HFA) 108 (90 Base) MCG/ACT inhaler Inhale 1-2 puffs into the lungs every 2 (two) hours as needed for wheezing or shortness of breath. 18 g 1  . fluticasone-salmeterol (ADVAIR HFA) 230-21 MCG/ACT inhaler Inhale 2 puffs into  the lungs 2 (two) times daily. 1 Inhaler 12  . ipratropium-albuterol (DUONEB) 0.5-2.5 (3) MG/3ML SOLN Take 3 mLs by nebulization every 6 (six) hours as needed. 360 mL 3   No facility-administered medications prior to visit.     Review of Systems:   Constitutional:   No  weight loss, night sweats,  Fevers, chills,  +fatigue, or  lassitude.  HEENT:   No headaches,  Difficulty swallowing,  Tooth/dental problems, or  Sore throat,                No sneezing, itching, ear ache, nasal congestion,  post nasal drip,   CV:  No chest pain,  Orthopnea, PND, swelling in lower extremities, anasarca, dizziness, palpitations, syncope.   GI  No heartburn, indigestion, abdominal pain, nausea, vomiting, diarrhea, change in bowel habits, loss of appetite, bloody stools.   Resp:   No chest wall deformity  Skin: no rash or lesions.  GU: no dysuria, change in color of urine, no urgency or frequency.  No flank pain, no hematuria   MS:  No joint pain or swelling.  No decreased range of motion.  No back pain.    Physical Exam  BP 120/70 (BP Location: Left Arm, Cuff Size: Normal)   Pulse 82   Temp 98.7 F (37.1 C) (Oral)   Ht 6\' 2"  (1.88 m)   Wt 192 lb 9.6 oz (87.4 kg)   SpO2 96%   BMI 24.73 kg/m   GEN: A/Ox3; pleasant , NAD    HEENT:  [place]/AT,    NOSE-clear, THROAT-clear, no lesions, no postnasal drip or exudate noted.   NECK:  Supple w/ fair ROM; no JVD; normal carotid impulses w/o bruits; no thyromegaly or nodules palpated; no lymphadenopathy.    RESP  Clear  P & A; w/o, wheezes/ rales/ or rhonchi. no accessory muscle use, no dullness to percussion  CARD:  RRR, no m/r/g, no peripheral edema, pulses intact, no cyanosis or clubbing.  GI:   Soft & nt; nml bowel sounds; no organomegaly or masses detected.   Musco: Warm bil, no deformities or joint swelling noted.   Neuro: alert, no focal deficits noted.    Skin: Warm, no lesions or rashes    Lab Results:   BMET  BNP No results found for: BNP  ProBNP No results found for: PROBNP  Imaging: No results found.    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  He has associated chronic dyspnea.  Lung function does show very slight improvement today.  Most recent high-resolution CT chest also showed slight improvement.  However patient continues to have ongoing symptoms.  Have advised him to increase his activities as tolerated. Continue on Advair.  Plan  Patient Instructions  Continue on Advair Twice daily , rinse after use .  Activity as tolerated.  Follow up with Dr. Everardo All in 3 months and As needed         History of COVID-19 Patient continues to have multiple complications from COVID-19 including fatigue, low activity tolerance chronic dyspnea.  He has postinflammatory fibrosis changes on CT chest.  Patient has not been able to return back to work.  He is only able to do minimum activities without having significant shortness of breath. Continue to follow closely     Rubye Oaks, NP 10/09/2019

## 2019-10-09 NOTE — Assessment & Plan Note (Signed)
Patient continues to have multiple complications from COVID-19 including fatigue, low activity tolerance chronic dyspnea.  He has postinflammatory fibrosis changes on CT chest.  Patient has not been able to return back to work.  He is only able to do minimum activities without having significant shortness of breath. Continue to follow closely

## 2019-10-12 LAB — SARS-COV-2 ANTIBODY(IGG)SPIKE,SEMI-QUANTITATIVE: SARS COV1 AB(IGG)SPIKE,SEMI QN: 3.21 index — ABNORMAL HIGH (ref <1.00)

## 2019-10-14 ENCOUNTER — Telehealth (HOSPITAL_COMMUNITY): Payer: Self-pay | Admitting: Family

## 2019-10-15 ENCOUNTER — Telehealth (HOSPITAL_COMMUNITY): Payer: Self-pay | Admitting: Family

## 2019-10-15 NOTE — Telephone Encounter (Signed)
Frederick Williams,  Are yo ok to refill the Albuterol or does pt need to contact pulmonology?

## 2019-10-16 ENCOUNTER — Other Ambulatory Visit: Payer: Self-pay | Admitting: Family

## 2019-10-16 MED ORDER — ALBUTEROL SULFATE HFA 108 (90 BASE) MCG/ACT IN AERS: 1.0000 | INHALATION_SPRAY | RESPIRATORY_TRACT | 1 refills | Status: DC | PRN

## 2019-12-31 ENCOUNTER — Telehealth: Payer: Self-pay | Admitting: Pulmonary Disease

## 2020-01-05 NOTE — Telephone Encounter (Signed)
Pt returning a phone call about the coding for his previous appointments. Pt can be reached at [phone].

## 2020-01-06 NOTE — Telephone Encounter (Signed)
Sent message to Marisue Ivan our coder to see if she can email the billing team to update patient's claims to reflect COVID-19 as the first diagnosis. In regards to the hospital billing, I am asking Marisue Ivan to see if she can get me a liaison that I can email to discuss updating those claims. I will contact Frederick Williams once I have information to give. -pr

## 2020-01-12 NOTE — Telephone Encounter (Signed)
[facility] at [phone] referenced QI#[id number], spoke to [facility] who advised all claims have been resubmitted with corrected diagnosis. Called [facility] at [phone] referenced Bill# [id number], spoke with Chyrl Civatte who has resubmitted claim with COVID diagnosis Z86.16 as primary. Called and updated patient. -pr

## 2020-01-12 NOTE — Telephone Encounter (Signed)
Called pt and discussed billing - advised that we have requested a corrected claim with the COVID diagnosis being primary - He received additional bills from both [facility] and [facility], I advised I will give them a call and see if I can help clarify what the correct diagnosis should be. He will also be calling his insurance carrier to advise that the corrected claims from our office has been sent as of 01/08/2020 -pr

## 2020-02-05 ENCOUNTER — Ambulatory Visit (INDEPENDENT_AMBULATORY_CARE_PROVIDER_SITE_OTHER): Payer: Managed Care, Other (non HMO) | Admitting: Family

## 2020-02-05 ENCOUNTER — Encounter: Payer: Self-pay | Admitting: Family

## 2020-02-05 DIAGNOSIS — Z7189 Other specified counseling: Secondary | ICD-10-CM | POA: Diagnosis not present

## 2020-02-05 DIAGNOSIS — R0609 Other forms of dyspnea: Secondary | ICD-10-CM

## 2020-02-05 DIAGNOSIS — Z8616 Personal history of COVID-19: Secondary | ICD-10-CM | POA: Diagnosis not present

## 2020-02-05 DIAGNOSIS — R0600 Dyspnea, unspecified: Secondary | ICD-10-CM

## 2020-02-05 DIAGNOSIS — J8410 Pulmonary fibrosis, unspecified: Secondary | ICD-10-CM

## 2020-02-05 MED ORDER — ALBUTEROL SULFATE HFA 108 (90 BASE) MCG/ACT IN AERS: 1.0000 | INHALATION_SPRAY | RESPIRATORY_TRACT | 1 refills | Status: DC | PRN

## 2020-02-05 NOTE — Progress Notes (Signed)
   Virtual Visit via telephone Note Due to COVID-19 pandemic this visit was conducted virtually. This visit type was conducted due to national recommendations for restrictions regarding the COVID-19 Pandemic (e.g. social distancing, sheltering in place) in an effort to limit this patient's exposure and mitigate transmission in our community. All issues noted in this document were discussed and addressed.  A physical exam was not performed with this format.  I connected with Frederick Williams on 02/05/20 at 11:30 AM  by telephone and video and verified that I am speaking with the correct person using two identifiers. Frederick Williams is currently located at home and no one is currently with him  during visit. The provider, Jannifer Rodney, FNP is located in their office at time of visit.  I discussed the limitations, risks, security and privacy concerns of performing an evaluation and management service by telephone and the availability of in person appointments. I also discussed with the patient that there may be a patient responsible charge related to this service. The patient expressed understanding and agreed to proceed.   History and Present Illness:  HPI  Pt calls the office today for follow up. He was diagnosed with COVD on 03/21/20 and was hospitalized and was diagnosed with COVID pneumonia. He has been followed by Pulmonologist and diagnosed with  Postinflammatory pulmonary fibrosis. He completed pulmonary rehab. He continues to have SOB on exertion and SOB after eating.   He states he currently is not working at this time, but hoping to go back.   Review of Systems  Respiratory: Positive for shortness of breath (on extertion ).   All other systems reviewed and are negative.    Observations/Objective: No SOB or distress noted  Assessment and Plan: Frederick Williams comes in today with chief complaint of No chief complaint on file.   Diagnosis and orders addressed:  1. Postinflammatory  pulmonary fibrosis (HCC)  2. History of COVID-19  3. Dyspnea on exertion  4. Educated about COVID-19 virus infection  Continue Advair BID  Keep Pulmonologist   Discussed about COVID vaccine, pt does not wish to take the vaccine at this time    I discussed the assessment and treatment plan with the patient. The patient was provided an opportunity to ask questions and all were answered. The patient agreed with the plan and demonstrated an understanding of the instructions.   The patient was advised to call back or seek an in-person evaluation if the symptoms worsen or if the condition fails to improve as anticipated.  The above assessment and management plan was discussed with the patient. The patient verbalized understanding of and has agreed to the management plan. Patient is aware to call the clinic if symptoms persist or worsen. Patient is aware when to return to the clinic for a follow-up visit. Patient educated on when it is appropriate to go to the emergency department.   Time call ended: 11:53 Am   I provided 23 minutes of non AND -face-to-face time during this encounter.    Jannifer Rodney, FNP

## 2020-03-24 ENCOUNTER — Encounter: Payer: Self-pay | Admitting: Pulmonary Disease

## 2020-04-04 ENCOUNTER — Other Ambulatory Visit: Payer: Self-pay | Admitting: Family

## 2020-05-08 ENCOUNTER — Other Ambulatory Visit: Payer: Self-pay | Admitting: Family

## 2020-06-01 ENCOUNTER — Ambulatory Visit: Payer: Managed Care, Other (non HMO) | Admitting: Family Medicine

## 2020-06-03 ENCOUNTER — Telehealth: Payer: 59 | Admitting: Nurse Practitioner

## 2020-06-03 DIAGNOSIS — K591 Functional diarrhea: Secondary | ICD-10-CM

## 2020-06-03 DIAGNOSIS — U071 COVID-19: Secondary | ICD-10-CM

## 2020-06-03 DIAGNOSIS — J029 Acute pharyngitis, unspecified: Secondary | ICD-10-CM

## 2020-06-03 DIAGNOSIS — R519 Headache, unspecified: Secondary | ICD-10-CM

## 2020-06-03 MED ORDER — NAPROXEN 500 MG PO TABS: 500.0000 mg | ORAL_TABLET | Freq: Two times a day (BID) | ORAL | 0 refills | Status: DC

## 2020-06-03 NOTE — Progress Notes (Signed)
E-Visit for Corona Virus Screening  We are sorry you are not feeling well. We are here to help!  You have tested positive for COVID-19, meaning that you were infected with the novel coronavirus and could give the virus to others.  It is vitally important that you stay home so you do not spread it to others.      Please continue isolation at home, for at least 10 days since the start of your symptoms and until you have had 24 hours with no fever (without taking a fever reducer) and with improving of symptoms.  If you have no symptoms but tested positive (or all symptoms resolve after 5 days and you have no fever) you can leave your house but continue to wear a mask around others for an additional 5 days. If you have a fever,continue to stay home until you have had 24 hours of no fever. Most cases improve 5-10 days from onset but we have seen a small number of patients who have gotten worse after the 10 days.  Please be sure to watch for worsening symptoms and remain taking the proper precautions.   Go to the nearest hospital ED for assessment if fever/cough/breathlessness are severe or illness seems like a threat to life.    The following symptoms may appear 2-14 days after exposure: . Fever . Cough . Shortness of breath or difficulty breathing . Chills . Repeated shaking with chills . Muscle pain . Headache . Sore throat . New loss of taste or smell . Fatigue . Congestion or runny nose . Nausea or vomiting . Diarrhea  You have been enrolled in [facility] Monitoring for COVID-19. Daily you will receive a questionnaire within the MyChart website. Our COVID-19 response team will be monitoring your responses daily.  You can use medication such as A prescription anti-inflammatory called Naprosyn 500 mg. Take twice daily as needed for fever or body aches for 2 weeks   You can use imodium AD OTC for the diarrhea  You have tested positive for Covid but because you are not considered high  risk you do not qualify for monoclonal antibody infusion.  Supportive care is all that is needed.   You may also take acetaminophen (Tylenol) as needed for fever.  HOME CARE: . Only take medications as instructed by your medical team. . Drink plenty of fluids and get plenty of rest. . A steam or ultrasonic humidifier can help if you have congestion.   GET HELP RIGHT AWAY IF YOU HAVE EMERGENCY WARNING SIGNS.  Call 911 or proceed to your closest emergency facility if: . You develop worsening high fever. . Trouble breathing . Bluish lips or face . Persistent pain or pressure in the chest . New confusion . Inability to wake or stay awake . You cough up blood. . Your symptoms become more severe . Inability to hold down food or fluids  This list is not all possible symptoms. Contact your medical provider for any symptoms that are severe or concerning to you.    Your e-visit answers were reviewed by a board certified advanced clinical practitioner to complete your personal care plan.  Depending on the condition, your plan could have included both over the counter or prescription medications.  If there is a problem please reply once you have received a response from your provider.  Your safety is important to [place].  If you have drug allergies check your prescription carefully.    You can use MyChart to ask  questions about today's visit, request a non-urgent call back, or ask for a work or school excuse for 24 hours related to this e-Visit. If it has been greater than 24 hours you will need to follow up with your provider, or enter a new e-Visit to address those concerns. You will get an e-mail in the next two days asking about your experience.  I hope that your e-visit has been valuable and will speed your recovery. Thank you for using e-visits.   5-10 minutes spent reviewing and documenting in chart.

## 2020-06-03 NOTE — Progress Notes (Signed)
Erroneous- repeat e visit

## 2020-06-04 ENCOUNTER — Ambulatory Visit: Payer: Self-pay

## 2020-06-06 ENCOUNTER — Telehealth: Payer: Self-pay

## 2020-06-06 NOTE — Telephone Encounter (Signed)
This is not a regular patient of ours and he will hav eto do another revisit for treatment.

## 2020-06-06 NOTE — Telephone Encounter (Signed)
Patient aware and states that he would like an antibiotic sent in for him.  States he is no better from his Evisit with MMM and states that he has been sick x 7 day.  C/O dizziness, nasal congestion & facial pressure. Please advise

## 2020-06-06 NOTE — Telephone Encounter (Signed)
According to his chart , he doe snot need a zpak. That is not the same as naprosyn either and cannot take its place.

## 2020-06-07 ENCOUNTER — Telehealth: Payer: Self-pay | Admitting: *Deleted

## 2020-06-07 MED ORDER — AZITHROMYCIN 250 MG PO TABS
ORAL_TABLET | ORAL | 0 refills | Status: DC
Start: 2020-06-07 — End: 2022-02-02

## 2020-06-07 NOTE — Telephone Encounter (Signed)
This message is being handled in new encounter

## 2020-06-07 NOTE — Telephone Encounter (Signed)
Zpak Prescription sent to pharmacy   

## 2020-06-07 NOTE — Telephone Encounter (Signed)
Pt called and is upset at the fact that he done a e-visit (with MMM) and was unable to get antibiotic sent in. He also called yesterday and didn't get a call back - he is upset about that as well.  He was requesting a antibiotic yesterday and it was denied again. Stating that her would need to be seen. He does not have any insurance and can't afford co-pay and xrays costs. His wife also had a visit here yesterday and has the same symptoms and was given a antibiotic.   Please review since you are PCP  - any suggestions would be helpful> Uses [facility] in [place] - please route back to Munjor B.  Thank you.

## 2020-06-07 NOTE — Telephone Encounter (Signed)
Pt aware.

## 2020-10-04 MED ORDER — ALBUTEROL SULFATE HFA 108 (90 BASE) MCG/ACT IN AERS: 1.0000 | INHALATION_SPRAY | RESPIRATORY_TRACT | 0 refills | Status: DC | PRN

## 2020-11-08 ENCOUNTER — Ambulatory Visit: Payer: Managed Care, Other (non HMO) | Admitting: Family Medicine

## 2020-11-23 ENCOUNTER — Other Ambulatory Visit: Payer: Self-pay | Admitting: Family Medicine

## 2020-12-21 ENCOUNTER — Other Ambulatory Visit: Payer: Self-pay | Admitting: Family

## 2021-01-14 ENCOUNTER — Other Ambulatory Visit: Payer: Self-pay | Admitting: Family

## 2021-01-16 NOTE — Telephone Encounter (Signed)
Pt called in and said he doesn't have health insurance and can't afford to come to doctor. Pt said he developed fibrosis when he had covid last year. He really needs his albuterol

## 2021-01-16 NOTE — Telephone Encounter (Signed)
(  NEEDS TO BE SEEN BEFORE NEXT REFILL) Patient last seen 02/2020.

## 2021-01-17 ENCOUNTER — Other Ambulatory Visit: Payer: Self-pay | Admitting: Family

## 2021-01-17 MED ORDER — ALBUTEROL SULFATE HFA 108 (90 BASE) MCG/ACT IN AERS: 1.0000 | INHALATION_SPRAY | RESPIRATORY_TRACT | 0 refills | Status: DC | PRN

## 2021-07-05 IMAGING — DX DG CHEST 1V PORT
2 series · 2 of 2 positions shown · non-contrast
Comparison: 04/10/2018

CLINICAL DATA: WZ69W-KI positive, fever, dyspnea

EXAM:
PORTABLE CHEST 1 VIEW

[chest ap (1 of 2)]
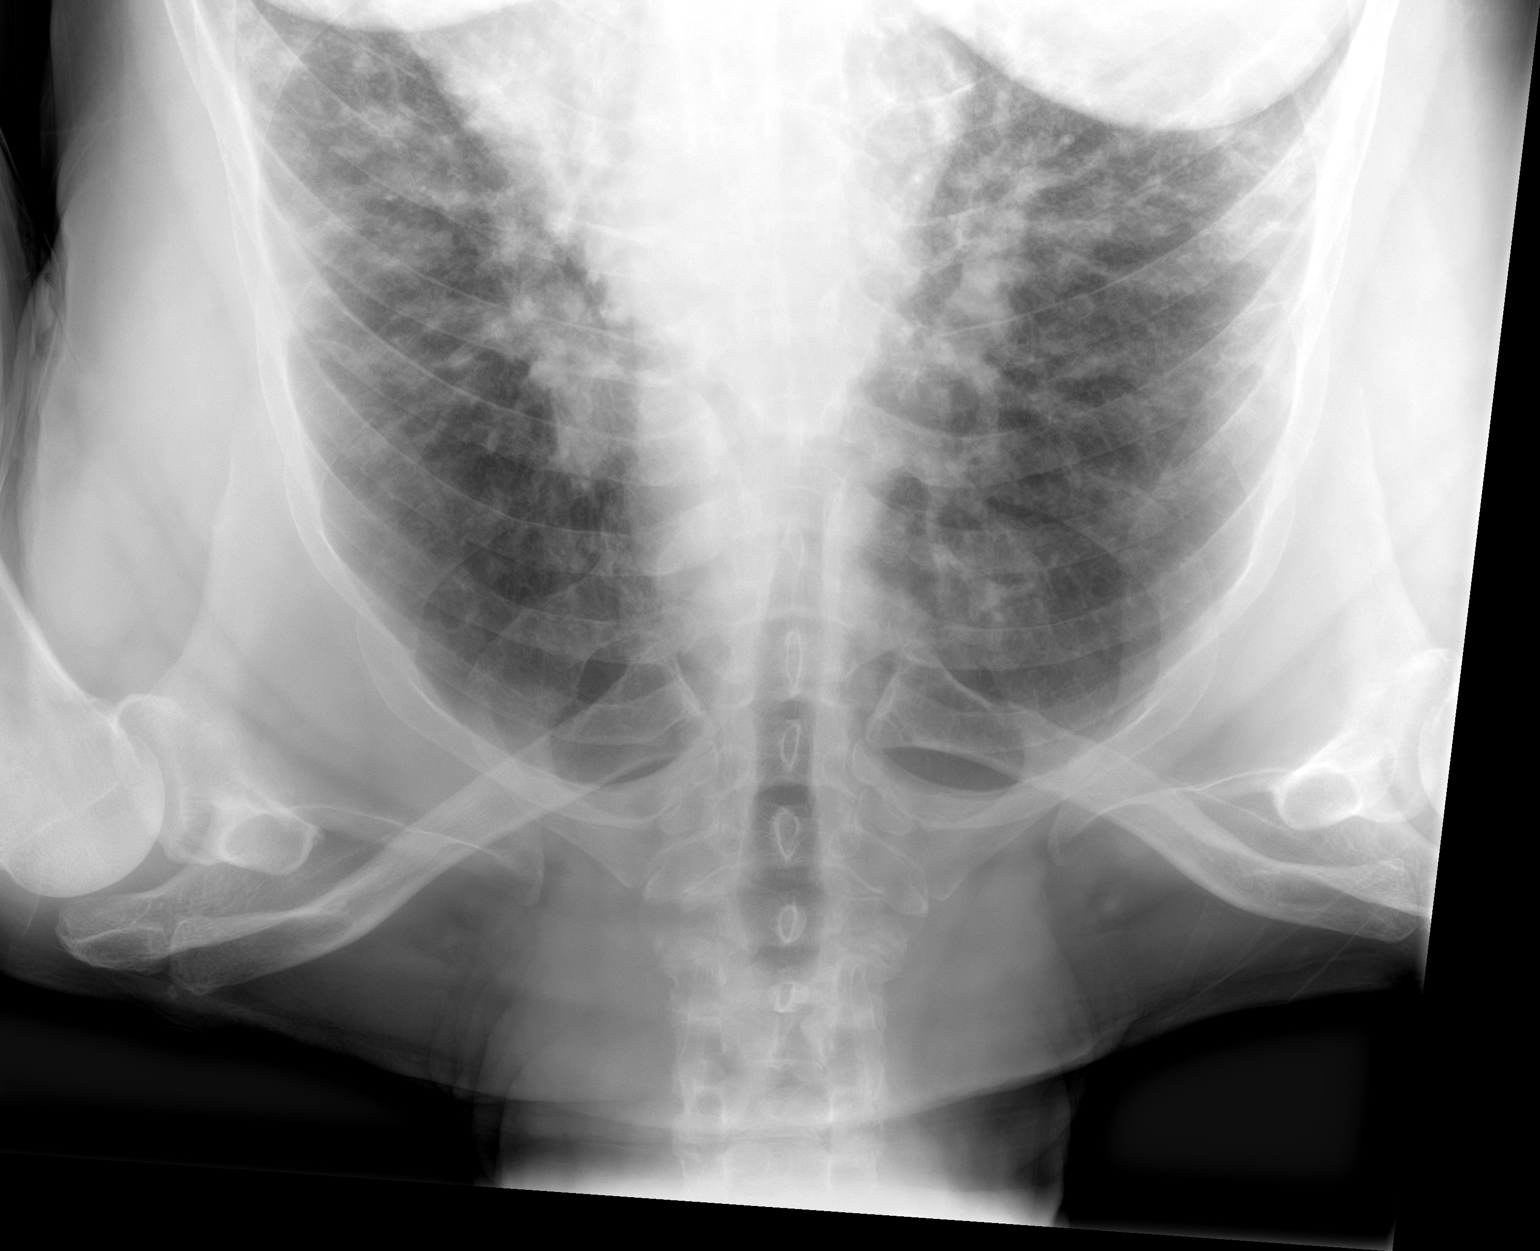

[chest ap (2 of 2)]
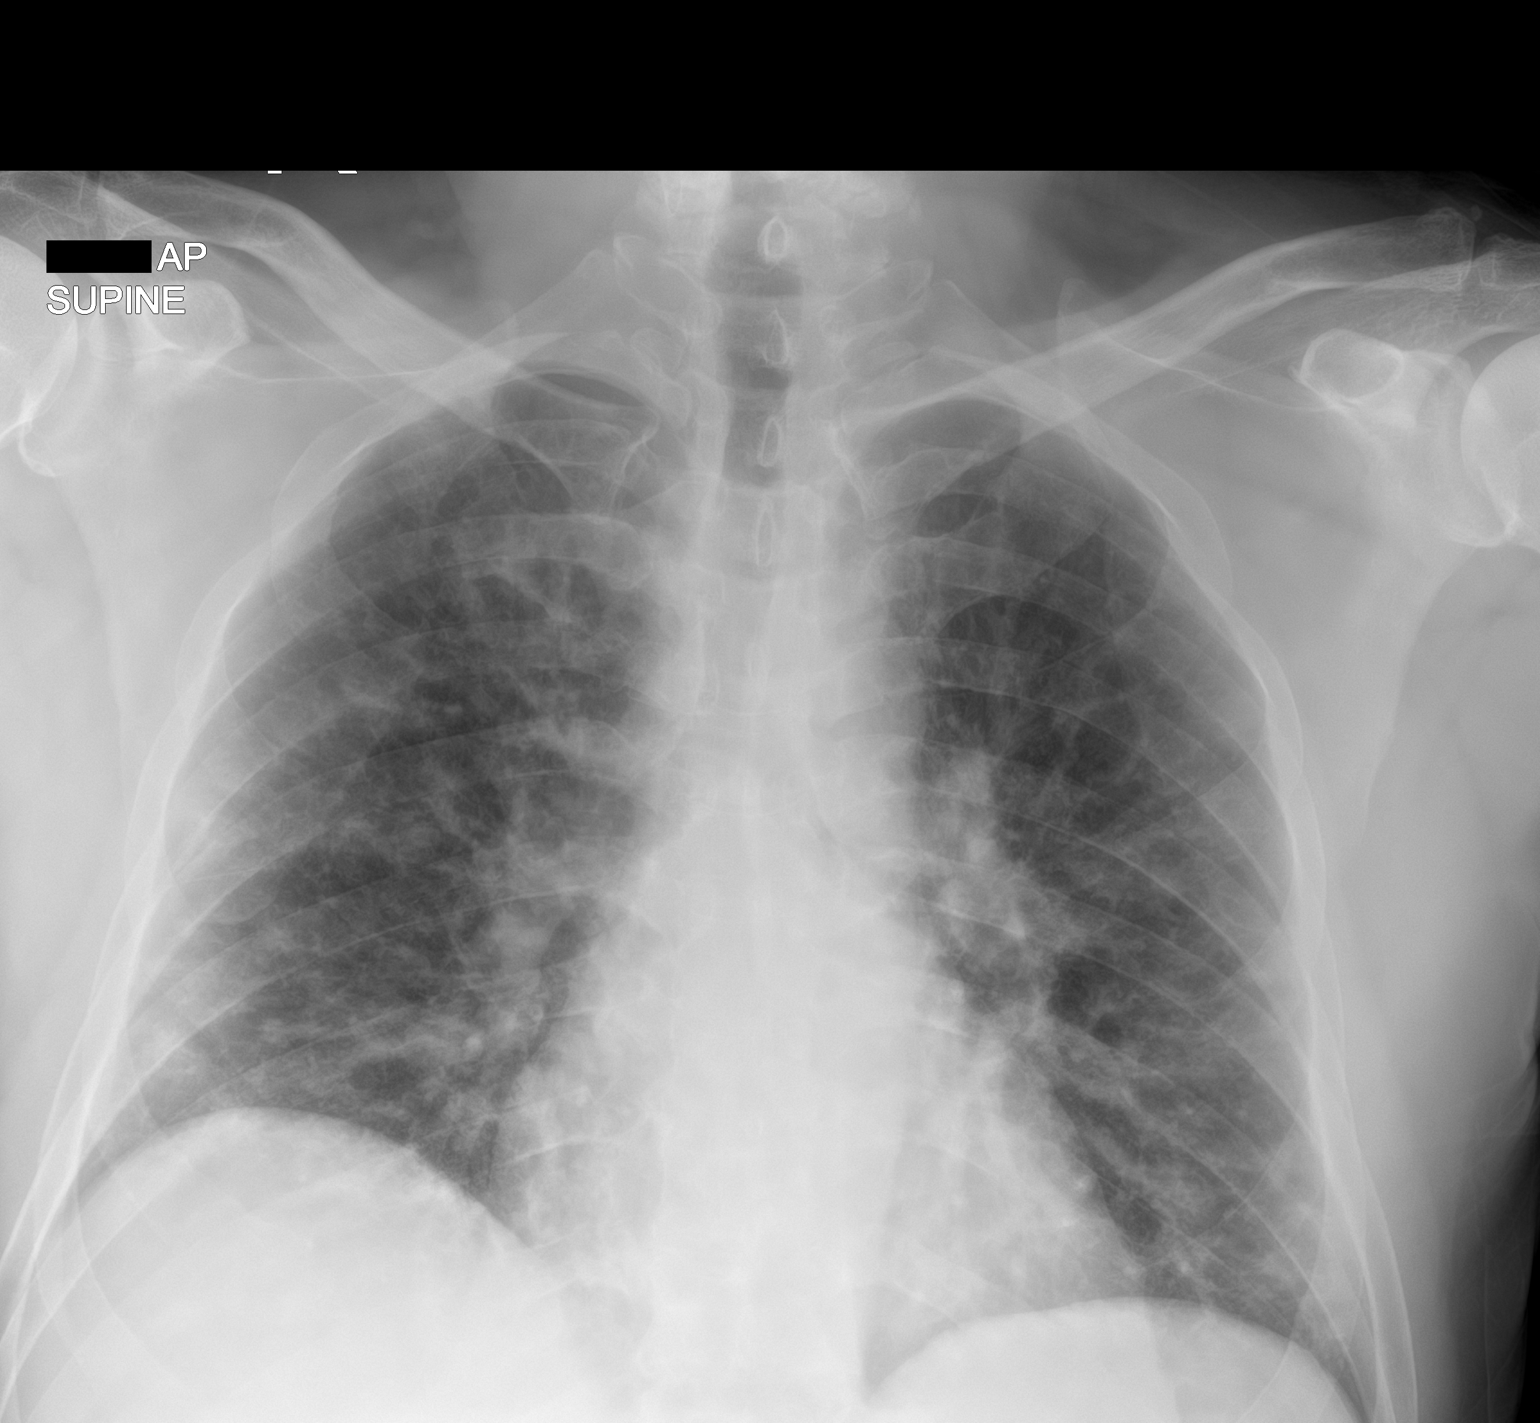

[2 of 2 positions shown; findings below may reference images not displayed]

FINDINGS: The heart size and mediastinal contours are within normal limits.
Diffusely scattered heterogeneous airspace opacity about the lungs.
The visualized skeletal structures are unremarkable.
IMPRESSION: Diffusely scattered heterogeneous airspace opacity about the lungs,
in keeping with reported WZ69W-KI infection.

## 2021-10-01 IMAGING — DX DG CHEST 2V
2 series · 2 of 2 positions shown · non-contrast
Comparison: 04/07/2019

CLINICAL DATA: Shortness of breath

EXAM:
CHEST - 2 VIEW

[chest pa]
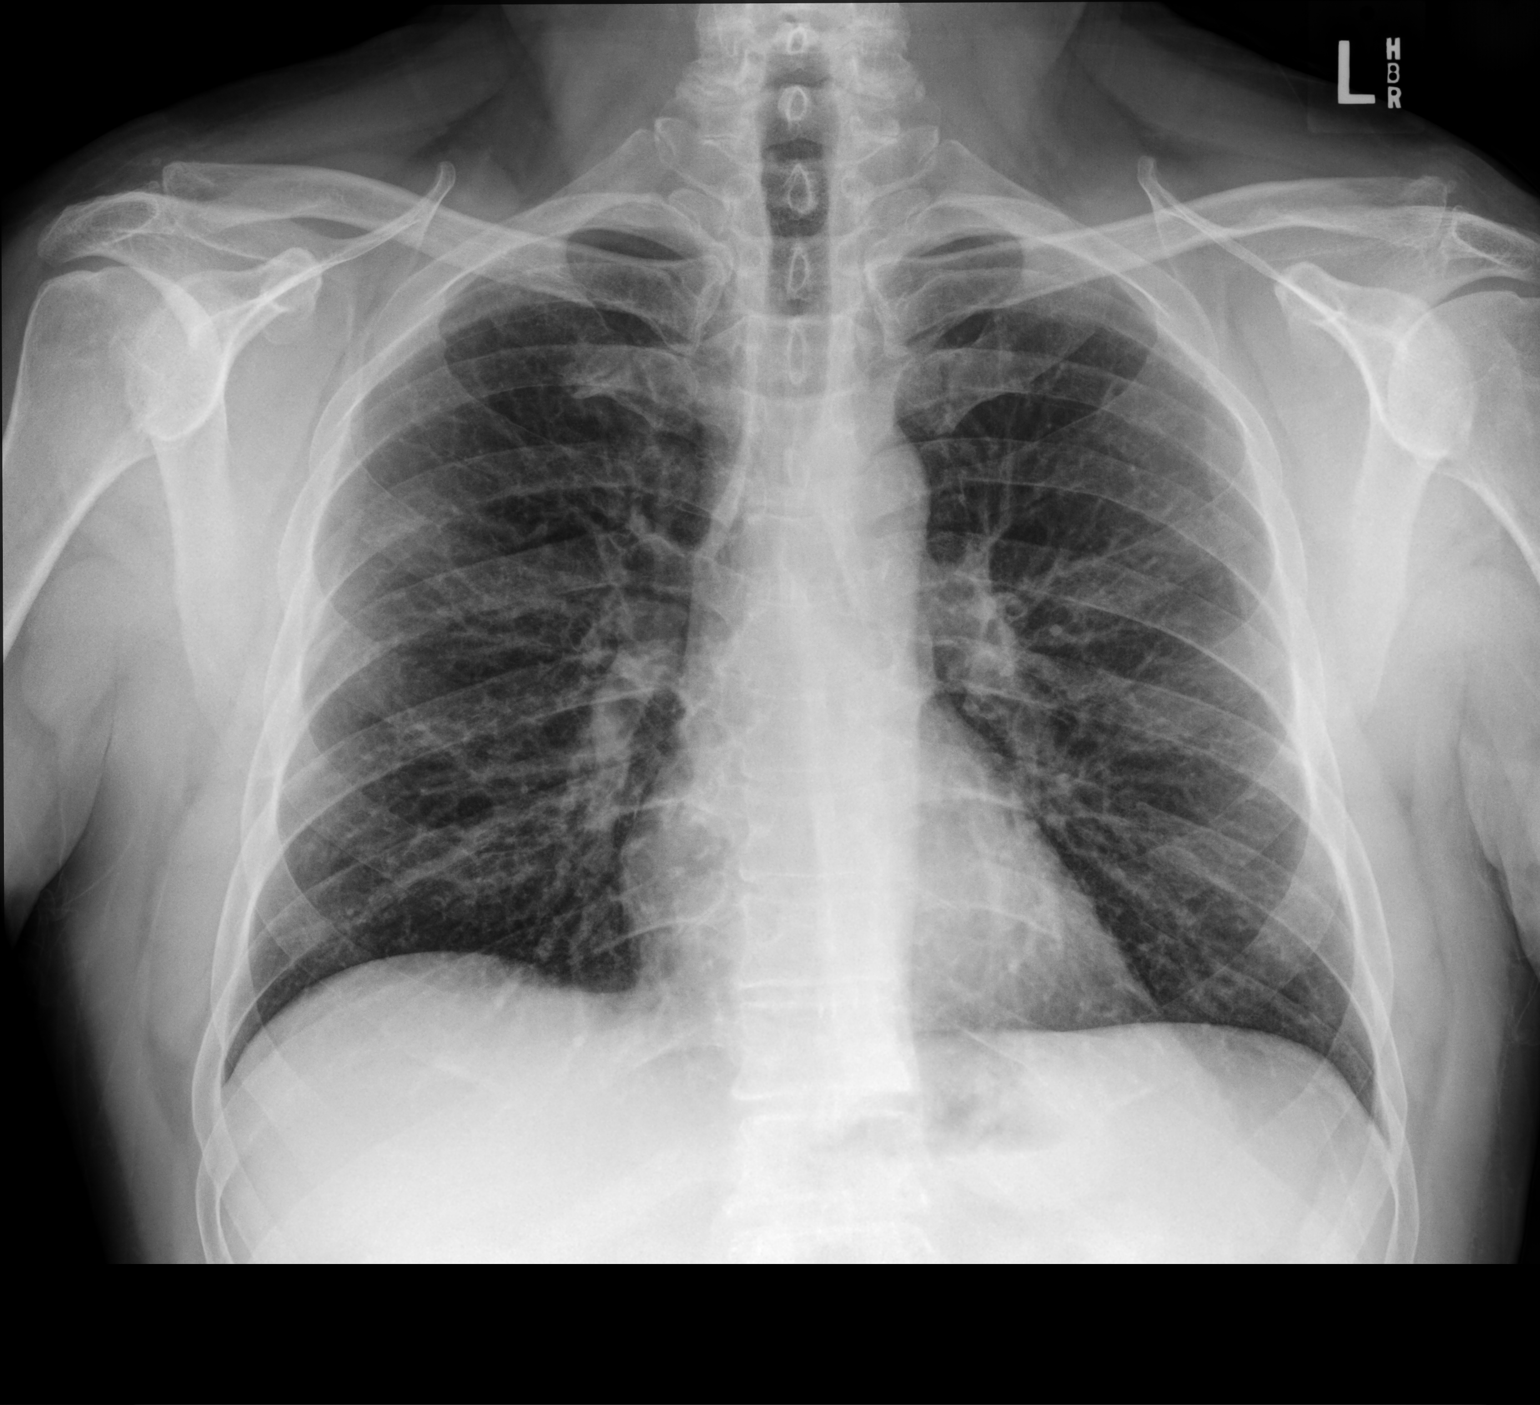

[chest lat]
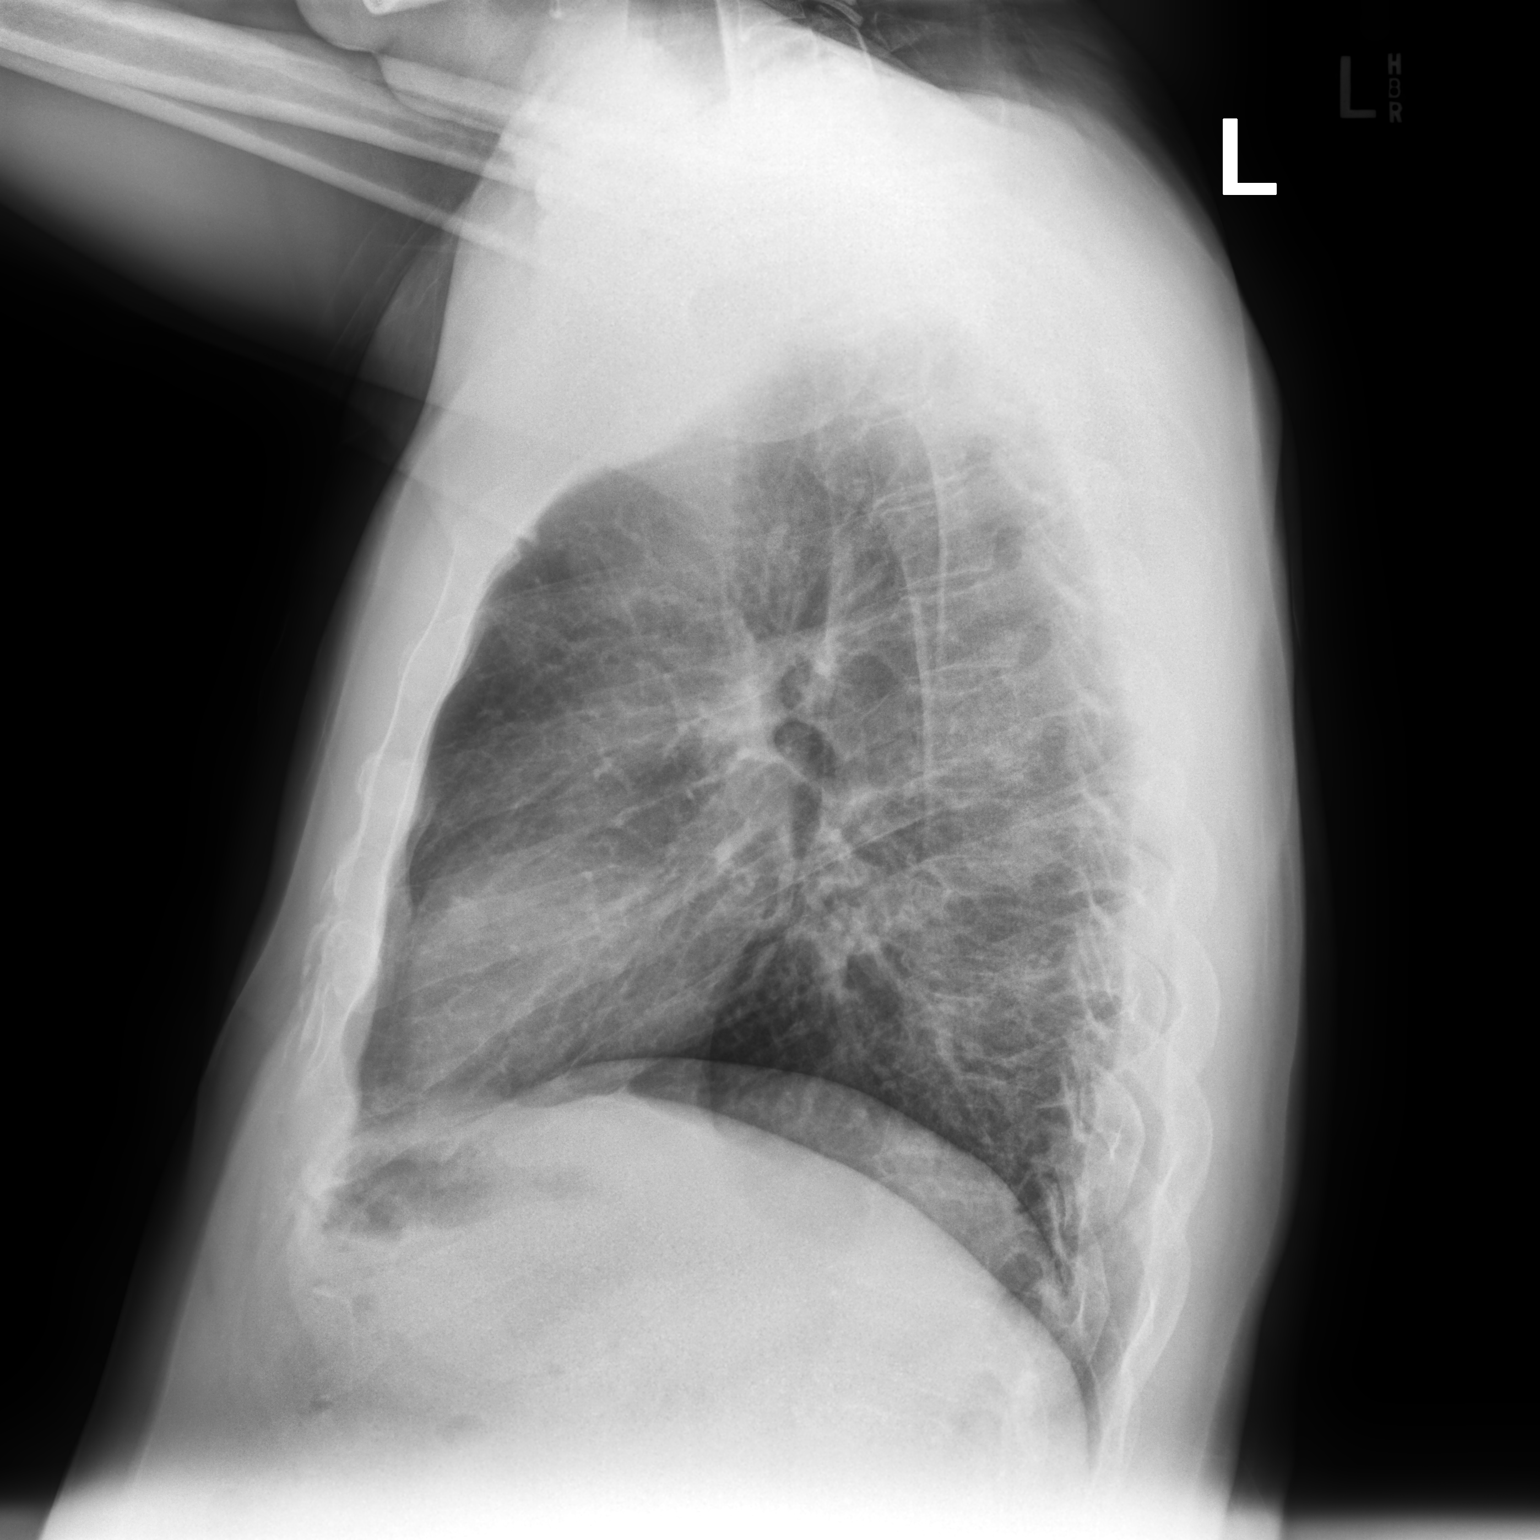

[2 of 2 positions shown; findings below may reference images not displayed]

FINDINGS: Heart and mediastinal contours are within normal limits. No focal
opacities or effusions. No acute bony abnormality.
IMPRESSION: No active cardiopulmonary disease.

## 2022-01-23 DIAGNOSIS — J8410 Pulmonary fibrosis, unspecified: Secondary | ICD-10-CM | POA: Diagnosis not present

## 2022-01-23 DIAGNOSIS — R1032 Left lower quadrant pain: Secondary | ICD-10-CM | POA: Diagnosis not present

## 2022-02-02 ENCOUNTER — Encounter: Payer: Self-pay | Admitting: Family

## 2022-02-02 ENCOUNTER — Ambulatory Visit (INDEPENDENT_AMBULATORY_CARE_PROVIDER_SITE_OTHER): Payer: PPO | Admitting: Family

## 2022-02-02 VITALS — BP 131/91 | HR 88 | Temp 98.3°F | Ht 74.0 in | Wt 155.8 lb

## 2022-02-02 DIAGNOSIS — Z8616 Personal history of COVID-19: Secondary | ICD-10-CM | POA: Diagnosis not present

## 2022-02-02 DIAGNOSIS — U099 Post covid-19 condition, unspecified: Secondary | ICD-10-CM | POA: Diagnosis not present

## 2022-02-02 DIAGNOSIS — J8410 Pulmonary fibrosis, unspecified: Secondary | ICD-10-CM

## 2022-02-02 DIAGNOSIS — K4090 Unilateral inguinal hernia, without obstruction or gangrene, not specified as recurrent: Secondary | ICD-10-CM | POA: Diagnosis not present

## 2022-02-02 MED ORDER — ALBUTEROL SULFATE HFA 108 (90 BASE) MCG/ACT IN AERS: 1.0000 | INHALATION_SPRAY | RESPIRATORY_TRACT | 1 refills | Status: DC | PRN

## 2022-02-02 NOTE — Progress Notes (Signed)
   Subjective:    Patient ID: Frederick Williams, male    DOB: 1960/11/01, 61 y.o.   MRN: [id number]  Chief Complaint  Patient presents with   Hernia    REFERRAL U/S SINCE MAY.    HPI PT presents to the office today with inguinal hernia that Frederick Williams noticed in June. Denies any any heavy lifting. States Frederick Williams noticed a bulge in left lower groin. Reports mild tenderness of 4 out 10. Denies any discoloration. It is reducible.   Frederick Williams has long haul COVID and postinflammatory pulmonary fibrosis and uses albuterol as needed. Has seen Pulmonologist's as needed.   Review of Systems  All other systems reviewed and are negative.      Objective:   Physical Exam Vitals reviewed.  Constitutional:      General: Frederick Williams is not in acute distress.    Appearance: Frederick Williams is well-developed.  HENT:     Head: Normocephalic.     Right Ear: Tympanic membrane normal.     Left Ear: Tympanic membrane normal.  Eyes:     General:        Right eye: No discharge.        Left eye: No discharge.     Pupils: Pupils are equal, round, and reactive to light.  Neck:     Thyroid: No thyromegaly.  Cardiovascular:     Rate and Rhythm: Normal rate and regular rhythm.     Heart sounds: Normal heart sounds. No murmur heard. Pulmonary:     Effort: Pulmonary effort is normal. No respiratory distress.     Breath sounds: Normal breath sounds. No wheezing.  Abdominal:     General: Bowel sounds are normal. There is no distension.     Palpations: Abdomen is soft.     Tenderness: There is no abdominal tenderness.     Hernia: A hernia is present. Hernia is present in the left inguinal area.  Musculoskeletal:        General: No tenderness. Normal range of motion.     Cervical back: Normal range of motion and neck supple.  Skin:    General: Skin is warm and dry.     Findings: No erythema or rash.  Neurological:     Mental Status: Frederick Williams is alert and oriented to person, place, and time.     Cranial Nerves: No cranial nerve deficit.     Deep  Tendon Reflexes: Reflexes are normal and symmetric.  Psychiatric:        Behavior: Behavior normal.        Thought Content: Thought content normal.        Judgment: Judgment normal.     BP (!) 131/91   Pulse 88   Temp 98.3 F (36.8 C) (Temporal)   Ht 6\' 2"  (1.88 m)   Wt 155 lb 12.8 oz (70.7 kg)   SpO2 98%   BMI 20.00 kg/m      Assessment & Plan:  Frederick Williams comes in today with chief complaint of Hernia (REFERRAL U/S SINCE MAY.)   Diagnosis and orders addressed:  1. Non-recurrent unilateral inguinal hernia without obstruction or gangrene - Ambulatory referral to General Surgery  2. Postinflammatory pulmonary fibrosis (HCC)  3. History of COVID-19  4. COVID-19 [place] pending Health Maintenance reviewed Diet and exercise encouraged  Follow up plan: Keep follow up   Frederick Dun, FNP

## 2022-02-02 NOTE — Patient Instructions (Signed)
Inguinal Hernia, Adult An inguinal hernia develops when fat or the intestines push through a weak spot in a muscle where the leg meets the lower abdomen (groin). This creates a bulge. This kind of hernia could also be: In the scrotum, if you are male. In folds of skin around the vagina, if you are male. There are three types of inguinal hernias: Hernias that can be pushed back into the abdomen (are reducible). This type rarely causes pain. Hernias that are not reducible (are incarcerated). Hernias that are not reducible and lose their blood supply (are strangulated). This type of hernia requires emergency surgery. What are the causes? This condition is caused by having a weak spot in the muscles or tissues in your groin. This develops over time. The hernia may poke through the weak spot when you suddenly strain your lower abdominal muscles, such as when you: Lift a heavy object. Strain to have a bowel movement. Constipation can lead to straining. Cough. What increases the risk? This condition is more likely to develop in: Males. Pregnant females. People who: Are overweight. Work in jobs that require long periods of standing or heavy lifting. Have had an inguinal hernia before. Smoke or have lung disease. These factors can lead to long-term (chronic) coughing. What are the signs or symptoms? Symptoms may depend on the size of the hernia. Often, a small inguinal hernia has no symptoms. Symptoms of a larger hernia may include: A bulge in the groin area. This is easier to see when standing. It might not be visible when lying down. Pain or burning in the groin. This may get worse when lifting, straining, or coughing. A dull ache or a feeling of pressure in the groin. An unusual bulge in the scrotum, in males. Symptoms of a strangulated inguinal hernia may include: A bulge in your groin that is very painful and tender to the touch. A bulge that turns red or purple. Fever, nausea, and  vomiting. Inability to have a bowel movement or to pass gas. How is this diagnosed? This condition is diagnosed based on your symptoms, your medical history, and a physical exam. Your health care provider may feel your groin area and ask you to cough. How is this treated? Treatment depends on the size of your hernia and whether you have symptoms. If you do not have symptoms, your health care provider may have you watch your hernia carefully and have you come in for follow-up visits. If your hernia is large or if you have symptoms, you may need surgery to repair the hernia. Follow these instructions at home: Lifestyle Avoid lifting heavy objects. Avoid standing for long periods of time. Do not use any products that contain nicotine or tobacco. These products include cigarettes, chewing tobacco, and vaping devices, such as e-cigarettes. If you need help quitting, ask your health care provider. Maintain a healthy weight. Preventing constipation You may need to take these actions to prevent or treat constipation: Drink enough fluid to keep your urine pale yellow. Take over-the-counter or prescription medicines. Eat foods that are high in fiber, such as beans, whole grains, and fresh fruits and vegetables. Limit foods that are high in fat and processed sugars, such as fried or sweet foods. General instructions You may try to push the hernia back in place by very gently pressing on it while lying down. Do not try to force the bulge back in if it will not push in easily. Watch your hernia for any changes in shape, size, or   color. Get help right away if you notice any changes. Take over-the-counter and prescription medicines only as told by your health care provider. Keep all follow-up visits. This is important. Contact a health care provider if: You have a fever or chills. You develop new symptoms. Your symptoms get worse. Get help right away if: You have pain in your groin that suddenly gets  worse. You have a bulge in your groin that: Suddenly gets bigger and does not get smaller. Becomes red or purple or painful to the touch. You are a man and you have a sudden pain in your scrotum, or the size of your scrotum suddenly changes. You cannot push the hernia back in place by very gently pressing on it when you are lying down. You have nausea or vomiting that does not go away. You have a fast heartbeat. You cannot have a bowel movement or pass gas. These symptoms may represent a serious problem that is an emergency. Do not wait to see if the symptoms will go away. Get medical help right away. Call your local emergency services (911 in the [place] Summary An inguinal hernia develops when fat or the intestines push through a weak spot in a muscle where your leg meets your lower abdomen (groin). This condition is caused by having a weak spot in muscles or tissues in your groin. Symptoms may depend on the size of the hernia, and they may include pain or swelling in your groin. A small inguinal hernia often has no symptoms. Treatment may not be needed if you do not have symptoms. If you have symptoms or a large hernia, you may need surgery to repair the hernia. Avoid lifting heavy objects. Also, avoid standing for long periods of time. This information is not intended to replace advice given to you by your health care provider. Make sure you discuss any questions you have with your health care provider. Document Revised: 12/22/2019 Document Reviewed: 12/22/2019 Elsevier Patient Education  2023 [employer] Inc.  

## 2022-02-23 DIAGNOSIS — K4090 Unilateral inguinal hernia, without obstruction or gangrene, not specified as recurrent: Secondary | ICD-10-CM | POA: Diagnosis not present

## 2022-02-26 NOTE — Progress Notes (Signed)
Surgery orders requested via Epic inbox. °

## 2022-02-27 NOTE — Progress Notes (Signed)
Please place orders for PAT appointment scheduled 02/28/22.

## 2022-02-27 NOTE — Patient Instructions (Signed)
SURGICAL WAITING ROOM VISITATION Patients having surgery or a procedure may have no more than 2 support people in the waiting area - these visitors may rotate.   Children under the age of 13 must have an adult with them who is not the patient. If the patient needs to stay at the hospital during part of their recovery, the visitor guidelines for inpatient rooms apply. Pre-op nurse will coordinate an appropriate time for 1 support person to accompany patient in pre-op.  This support person may not rotate.    Please refer to the [facility] website for the visitor guidelines for Inpatients (after your surgery is over and you are in a regular room).    Your procedure is scheduled on: 03/01/22   Report to [facility] Main Entrance    Report to admitting at 8:15 AM   Call this number if you have problems the morning of surgery [phone]   Do not eat food :After Midnight.   After Midnight you may have the following liquids until 7:30 AM DAY OF SURGERY  Water Non-Citrus Juices (without pulp, NO RED) Carbonated Beverages Black Coffee (NO MILK/CREAM OR CREAMERS, sugar ok)  Clear Tea (NO MILK/CREAM OR CREAMERS, sugar ok) regular and decaf                             Plain Jell-O (NO RED)                                           Fruit ices (not with fruit pulp, NO RED)                                     Popsicles (NO RED)                                                               Sports drinks like Gatorade (NO RED)          If you have questions, please contact your surgeon's office.   FOLLOW BOWEL PREP AND ANY ADDITIONAL PRE OP INSTRUCTIONS YOU RECEIVED FROM YOUR SURGEON'S OFFICE!!!     Oral Hygiene is also important to reduce your risk of infection.                                    Remember - BRUSH YOUR TEETH THE MORNING OF SURGERY WITH YOUR REGULAR TOOTHPASTE   Take these medicines the morning of surgery with A SIP OF WATER: Inhaler                               You  may not have any metal on your body including jewelry, and body piercing             Do not wear lotions, powders, cologne, or deodorant              Men may shave face and neck.  Do not bring valuables to the hospital. [place].  DO NOT [place]. PHARMACY WILL DISPENSE MEDICATIONS LISTED ON YOUR MEDICATION LIST TO YOU DURING YOUR ADMISSION [place]!    Patients discharged on the day of surgery will not be allowed to drive home.  Someone NEEDS to stay with you for the first 24 hours after anesthesia.              Please read over the following fact sheets you were given: IF [place] [phone] Schneiders   If you received a COVID test during your pre-op visit  it is requested that you wear a mask when out in public, stay away from anyone that may not be feeling well and notify your surgeon if you develop symptoms. If you test positive for Covid or have been in contact with anyone that has tested positive in the last 10 days please notify you surgeon.     [place] - Preparing for Surgery Before surgery, you can play an important role.  Because skin is not sterile, your skin needs to be as free of germs as possible.  You can reduce the number of germs on your skin by washing with CHG (chlorahexidine gluconate) soap before surgery.  CHG is an antiseptic cleaner which kills germs and bonds with the skin to continue killing germs even after washing. Please DO NOT use if you have an allergy to CHG or antibacterial soaps.  If your skin becomes reddened/irritated stop using the CHG and inform your nurse when you arrive at Short Stay. Do not shave (including legs and underarms) for at least 48 hours prior to the first CHG shower.  You may shave your face/neck.  Please follow these instructions carefully:  1.  Shower with CHG Soap the night before surgery  and the  morning of surgery.  2.  If you choose to wash your hair, wash your hair first as usual with your normal  shampoo.  3.  After you shampoo, rinse your hair and body thoroughly to remove the shampoo.                             4.  Use CHG as you would any other liquid soap.  You can apply chg directly to the skin and wash.  Gently with a scrungie or clean washcloth.  5.  Apply the CHG Soap to your body ONLY FROM THE NECK DOWN.   Do   not use on face/ open                           Wound or open sores. Avoid contact with eyes, ears mouth and   genitals (private parts).                       Wash face,  Genitals (private parts) with your normal soap.             6.  Wash thoroughly, paying special attention to the area where your    surgery  will be performed.  7.  Thoroughly rinse your body with warm water from the neck down.  8.  DO NOT shower/wash with your normal soap after using  and rinsing off the CHG Soap.                9.  Pat yourself dry with a clean towel.            10.  Wear clean pajamas.            11.  Place clean sheets on your bed the night of your first shower and do not  sleep with pets. Day of Surgery : Do not apply any lotions/deodorants the morning of surgery.  Please wear clean clothes to the hospital/surgery center.  FAILURE TO FOLLOW THESE INSTRUCTIONS MAY RESULT IN THE CANCELLATION OF YOUR SURGERY  PATIENT SIGNATURE_________________________________  NURSE SIGNATURE__________________________________  ________________________________________________________________________

## 2022-02-27 NOTE — Progress Notes (Signed)
COVID Vaccine Completed:  Date of COVID positive in last 90 days:  PCP - Evelina Dun, FNP Cardiologist -   Chest x-ray -  EKG -  Stress Test -  ECHO -  Cardiac Cath -  Pacemaker/ICD device last checked: Spinal Cord Stimulator:  Bowel Prep -   Sleep Study -  CPAP -   Fasting Blood Sugar -  Checks Blood Sugar _____ times a day  Blood Thinner Instructions: Aspirin Instructions: Last Dose:  Activity level:  Can go up a flight of stairs and perform activities of daily living without stopping and without symptoms of chest pain or shortness of breath.  Able to exercise without symptoms  Unable to go up a flight of stairs without symptoms of     Anesthesia review:   Patient denies shortness of breath, fever, cough and chest pain at PAT appointment  Patient verbalized understanding of instructions that were given to them at the PAT appointment. Patient was also instructed that they will need to review over the PAT instructions again at home before surgery.

## 2022-02-28 ENCOUNTER — Encounter (HOSPITAL_COMMUNITY)
Admission: RE | Admit: 2022-02-28 | Discharge: 2022-02-28 | Disposition: A | Discharge status: Home / Self Care | Payer: PPO | Source: Ambulatory Visit | Attending: General Surgery | Admitting: General Surgery

## 2022-02-28 ENCOUNTER — Encounter (HOSPITAL_COMMUNITY): Payer: Self-pay

## 2022-02-28 VITALS — BP 123/90 | HR 80 | Temp 98.2°F | Resp 16 | Ht 72.0 in | Wt 155.0 lb

## 2022-02-28 DIAGNOSIS — Z01818 Encounter for other preprocedural examination: Secondary | ICD-10-CM

## 2022-02-28 DIAGNOSIS — Z01812 Encounter for preprocedural laboratory examination: Secondary | ICD-10-CM | POA: Diagnosis not present

## 2022-02-28 LAB — CBC
HCT: 49.5 % (ref 39.0–52.0)
Hemoglobin: 16.9 g/dL (ref 13.0–17.0)
MCH: 31 pg (ref 26.0–34.0)
MCHC: 34.1 g/dL (ref 30.0–36.0)
MCV: 90.8 fL (ref 80.0–100.0)
Platelets: 205 10*3/uL (ref 150–400)
RBC: 5.45 MIL/uL (ref 4.22–5.81)
RDW: 12.3 % (ref 11.5–15.5)
WBC: 6.3 10*3/uL (ref 4.0–10.5)
nRBC: 0 % (ref 0.0–0.2)

## 2022-02-28 NOTE — Anesthesia Preprocedure Evaluation (Addendum)
Anesthesia Evaluation  Patient identified by MRN, date of birth, ID band Patient awake    Reviewed: Allergy & Precautions, NPO status , Patient's Chart, lab work & pertinent test results  History of Anesthesia Complications Negative for: history of anesthetic complications  Airway Mallampati: II  TM Distance: >3 FB Neck ROM: Full    Dental no notable dental hx.    Pulmonary former smoker,    Pulmonary exam normal        Cardiovascular negative cardio ROS Normal cardiovascular exam     Neuro/Psych negative neurological ROS     GI/Hepatic Neg liver ROS, LEFT INGUINAL HERNIA   Endo/Other  negative endocrine ROS  Renal/GU negative Renal ROS  negative genitourinary   Musculoskeletal negative musculoskeletal ROS (+)   Abdominal   Peds  Hematology negative hematology ROS (+)   Anesthesia Other Findings Day of surgery medications reviewed with patient.  Reproductive/Obstetrics negative OB ROS                            Anesthesia Physical Anesthesia Plan  ASA: 2  Anesthesia Plan: General   Post-op Pain Management: Regional block* and Tylenol PO (pre-op)*   Induction: Intravenous  PONV Risk Score and Plan: 2 and Treatment may vary due to age or medical condition, Ondansetron, Dexamethasone and Midazolam  Airway Management Planned: Oral ETT  Additional Equipment: None  Intra-op Plan:   Post-operative Plan: Extubation in OR  Informed Consent: I have reviewed the patients History and Physical, chart, labs and discussed the procedure including the risks, benefits and alternatives for the proposed anesthesia with the patient or authorized representative who has indicated his/her understanding and acceptance.     Dental advisory given  Plan Discussed with: CRNA  Anesthesia Plan Comments:        Anesthesia Quick Evaluation

## 2022-03-01 ENCOUNTER — Ambulatory Visit (HOSPITAL_COMMUNITY)
Admission: RE | Admit: 2022-03-01 | Discharge: 2022-03-01 | Disposition: A | Discharge status: Home / Self Care | Payer: PPO | Attending: General Surgery | Admitting: General Surgery

## 2022-03-01 ENCOUNTER — Encounter (HOSPITAL_COMMUNITY): Payer: Self-pay | Admitting: General Surgery

## 2022-03-01 ENCOUNTER — Ambulatory Visit (HOSPITAL_COMMUNITY): Payer: PPO | Admitting: Physician Assistant

## 2022-03-01 ENCOUNTER — Ambulatory Visit (HOSPITAL_BASED_OUTPATIENT_CLINIC_OR_DEPARTMENT_OTHER): Payer: PPO | Admitting: Anesthesiology

## 2022-03-01 ENCOUNTER — Encounter (HOSPITAL_COMMUNITY)
Admission: RE | Disposition: A | Discharge status: Home / Self Care | Payer: Self-pay | Source: Home / Self Care | Attending: General Surgery

## 2022-03-01 ENCOUNTER — Other Ambulatory Visit: Payer: Self-pay

## 2022-03-01 DIAGNOSIS — G8918 Other acute postprocedural pain: Secondary | ICD-10-CM | POA: Diagnosis not present

## 2022-03-01 DIAGNOSIS — K4090 Unilateral inguinal hernia, without obstruction or gangrene, not specified as recurrent: Secondary | ICD-10-CM | POA: Diagnosis not present

## 2022-03-01 DIAGNOSIS — Z87891 Personal history of nicotine dependence: Secondary | ICD-10-CM | POA: Diagnosis not present

## 2022-03-01 HISTORY — PX: INGUINAL HERNIA REPAIR: SHX194

## 2022-03-01 SURGERY — REPAIR, HERNIA, INGUINAL, ADULT
Anesthesia: General | Laterality: Left

## 2022-03-01 MED ORDER — LACTATED RINGERS IV SOLN: INTRAVENOUS | Status: DC

## 2022-03-01 MED ORDER — MIDAZOLAM HCL 2 MG/2ML IJ SOLN
INTRAMUSCULAR | Status: AC
  Filled 2022-03-01: qty 2

## 2022-03-01 MED ORDER — IBUPROFEN 800 MG PO TABS: 800.0000 mg | ORAL_TABLET | Freq: Three times a day (TID) | ORAL | 0 refills | Status: DC | PRN

## 2022-03-01 MED ORDER — AMISULPRIDE (ANTIEMETIC) 5 MG/2ML IV SOLN: 10.0000 mg | Freq: Once | INTRAVENOUS | Status: DC | PRN

## 2022-03-01 MED ORDER — ONDANSETRON HCL 4 MG/2ML IJ SOLN
INTRAMUSCULAR | Status: AC
  Filled 2022-03-01: qty 2

## 2022-03-01 MED ORDER — FENTANYL CITRATE PF 50 MCG/ML IJ SOSY
25.0000 ug | PREFILLED_SYRINGE | INTRAMUSCULAR | Status: DC | PRN
  Administered 2022-03-01 (×2): 50 ug via INTRAVENOUS

## 2022-03-01 MED ORDER — CHLORHEXIDINE GLUCONATE CLOTH 2 % EX PADS: 6.0000 | MEDICATED_PAD | Freq: Once | CUTANEOUS | Status: DC

## 2022-03-01 MED ORDER — 0.9 % SODIUM CHLORIDE (POUR BTL) OPTIME
TOPICAL | Status: DC | PRN
  Administered 2022-03-01: 1000 mL

## 2022-03-01 MED ORDER — ONDANSETRON HCL 4 MG/2ML IJ SOLN
INTRAMUSCULAR | Status: DC | PRN
  Administered 2022-03-01: 4 mg via INTRAVENOUS

## 2022-03-01 MED ORDER — MIDAZOLAM HCL 5 MG/5ML IJ SOLN
INTRAMUSCULAR | Status: DC | PRN
  Administered 2022-03-01: 2 mg via INTRAVENOUS

## 2022-03-01 MED ORDER — BUPIVACAINE LIPOSOME 1.3 % IJ SUSP
INTRAMUSCULAR | Status: DC | PRN
  Administered 2022-03-01: 10 mL via PERINEURAL

## 2022-03-01 MED ORDER — ROCURONIUM BROMIDE 10 MG/ML (PF) SYRINGE
PREFILLED_SYRINGE | INTRAVENOUS | Status: DC | PRN
  Administered 2022-03-01: 60 mg via INTRAVENOUS

## 2022-03-01 MED ORDER — CEFAZOLIN SODIUM-DEXTROSE 2-4 GM/100ML-% IV SOLN
2.0000 g | INTRAVENOUS | Status: AC
  Administered 2022-03-01: 2 g via INTRAVENOUS
  Filled 2022-03-01: qty 100

## 2022-03-01 MED ORDER — DEXAMETHASONE SODIUM PHOSPHATE 10 MG/ML IJ SOLN
INTRAMUSCULAR | Status: AC
  Filled 2022-03-01: qty 1

## 2022-03-01 MED ORDER — FENTANYL CITRATE (PF) 100 MCG/2ML IJ SOLN
INTRAMUSCULAR | Status: DC | PRN
  Administered 2022-03-01: 100 ug via INTRAVENOUS

## 2022-03-01 MED ORDER — BUPIVACAINE-EPINEPHRINE (PF) 0.5% -1:200000 IJ SOLN
INTRAMUSCULAR | Status: DC | PRN
  Administered 2022-03-01: 20 mL via PERINEURAL

## 2022-03-01 MED ORDER — OXYCODONE HCL 5 MG PO TABS: 5.0000 mg | ORAL_TABLET | Freq: Four times a day (QID) | ORAL | 0 refills | Status: DC | PRN

## 2022-03-01 MED ORDER — BUPIVACAINE LIPOSOME 1.3 % IJ SUSP
INTRAMUSCULAR | Status: AC
  Filled 2022-03-01: qty 20

## 2022-03-01 MED ORDER — CHLORHEXIDINE GLUCONATE 0.12 % MT SOLN
15.0000 mL | Freq: Once | OROMUCOSAL | Status: AC
  Administered 2022-03-01: 15 mL via OROMUCOSAL

## 2022-03-01 MED ORDER — FENTANYL CITRATE PF 50 MCG/ML IJ SOSY
PREFILLED_SYRINGE | INTRAMUSCULAR | Status: AC
  Filled 2022-03-01: qty 2

## 2022-03-01 MED ORDER — OXYCODONE HCL 5 MG PO TABS: 5.0000 mg | ORAL_TABLET | Freq: Once | ORAL | Status: DC | PRN

## 2022-03-01 MED ORDER — OXYCODONE HCL 5 MG/5ML PO SOLN: 5.0000 mg | Freq: Once | ORAL | Status: DC | PRN

## 2022-03-01 MED ORDER — ROCURONIUM BROMIDE 10 MG/ML (PF) SYRINGE
PREFILLED_SYRINGE | INTRAVENOUS | Status: AC
  Filled 2022-03-01: qty 10

## 2022-03-01 MED ORDER — ORAL CARE MOUTH RINSE: 15.0000 mL | Freq: Once | OROMUCOSAL | Status: AC

## 2022-03-01 MED ORDER — LIDOCAINE 2% (20 MG/ML) 5 ML SYRINGE
INTRAMUSCULAR | Status: DC | PRN
  Administered 2022-03-01: 100 mg via INTRAVENOUS

## 2022-03-01 MED ORDER — SUGAMMADEX SODIUM 200 MG/2ML IV SOLN
INTRAVENOUS | Status: DC | PRN
  Administered 2022-03-01: 200 mg via INTRAVENOUS

## 2022-03-01 MED ORDER — BUPIVACAINE-EPINEPHRINE (PF) 0.25% -1:200000 IJ SOLN
INTRAMUSCULAR | Status: AC
  Filled 2022-03-01: qty 30

## 2022-03-01 MED ORDER — DEXAMETHASONE SODIUM PHOSPHATE 10 MG/ML IJ SOLN
INTRAMUSCULAR | Status: DC | PRN
  Administered 2022-03-01: 6 mg via INTRAVENOUS

## 2022-03-01 MED ORDER — ACETAMINOPHEN 500 MG PO TABS: 1000.0000 mg | ORAL_TABLET | ORAL | Status: DC

## 2022-03-01 MED ORDER — LIDOCAINE HCL (PF) 2 % IJ SOLN
INTRAMUSCULAR | Status: AC
  Filled 2022-03-01: qty 5

## 2022-03-01 MED ORDER — PROPOFOL 10 MG/ML IV BOLUS
INTRAVENOUS | Status: DC | PRN
  Administered 2022-03-01: 160 mg via INTRAVENOUS

## 2022-03-01 MED ORDER — FENTANYL CITRATE (PF) 100 MCG/2ML IJ SOLN
INTRAMUSCULAR | Status: AC
  Filled 2022-03-01: qty 2

## 2022-03-01 MED ORDER — FENTANYL CITRATE PF 50 MCG/ML IJ SOSY
50.0000 ug | PREFILLED_SYRINGE | INTRAMUSCULAR | Status: DC
  Administered 2022-03-01: 100 ug via INTRAVENOUS
  Filled 2022-03-01: qty 2

## 2022-03-01 MED ORDER — BUPIVACAINE-EPINEPHRINE (PF) 0.25% -1:200000 IJ SOLN
INTRAMUSCULAR | Status: DC | PRN
  Administered 2022-03-01: 20 mL via PERINEURAL

## 2022-03-01 MED ORDER — ACETAMINOPHEN 500 MG PO TABS
1000.0000 mg | ORAL_TABLET | Freq: Once | ORAL | Status: AC
  Administered 2022-03-01: 1000 mg via ORAL
  Filled 2022-03-01: qty 2

## 2022-03-01 MED ORDER — PROPOFOL 10 MG/ML IV BOLUS
INTRAVENOUS | Status: AC
  Filled 2022-03-01: qty 20

## 2022-03-01 MED ORDER — MIDAZOLAM HCL 2 MG/2ML IJ SOLN
1.0000 mg | INTRAMUSCULAR | Status: DC
  Administered 2022-03-01: 2 mg via INTRAVENOUS
  Filled 2022-03-01: qty 2

## 2022-03-01 MED ORDER — BUPIVACAINE HCL (PF) 0.5 % IJ SOLN
INTRAMUSCULAR | Status: AC
  Filled 2022-03-01: qty 30

## 2022-03-01 SURGICAL SUPPLY — 40 items
BAG COUNTER SPONGE SURGICOUNT (BAG) ×1 IMPLANT
BENZOIN TINCTURE PRP APPL 2/3 (GAUZE/BANDAGES/DRESSINGS) IMPLANT
BLADE SURG 15 STRL LF DISP TIS (BLADE) ×1 IMPLANT
BLADE SURG 15 STRL SS (BLADE) ×1
CHLORAPREP W/TINT 26 (MISCELLANEOUS) ×1 IMPLANT
COVER SURGICAL LIGHT HANDLE (MISCELLANEOUS) ×1 IMPLANT
DERMABOND ADVANCED .7 DNX12 (GAUZE/BANDAGES/DRESSINGS) ×1 IMPLANT
DRAIN PENROSE 0.5X18 (DRAIN) IMPLANT
DRAPE LAPAROTOMY TRNSV 102X78 (DRAPES) ×1 IMPLANT
DRAPE UTILITY 15X26 TOWEL STRL (DRAPES) ×1 IMPLANT
DRSG TELFA PLUS 4X6 ADH ISLAND (GAUZE/BANDAGES/DRESSINGS) IMPLANT
ELECT REM PT RETURN 15FT ADLT (MISCELLANEOUS) ×1 IMPLANT
GAUZE SPONGE 4X4 12PLY STRL (GAUZE/BANDAGES/DRESSINGS) IMPLANT
GLOVE BIOGEL PI IND STRL 7.0 (GLOVE) IMPLANT
GLOVE SURG SS PI 7.0 STRL IVOR (GLOVE) ×1 IMPLANT
GOWN STRL REUS W/ TWL LRG LVL3 (GOWN DISPOSABLE) ×1 IMPLANT
GOWN STRL REUS W/ TWL XL LVL3 (GOWN DISPOSABLE) IMPLANT
GOWN STRL REUS W/TWL LRG LVL3 (GOWN DISPOSABLE) ×1
GOWN STRL REUS W/TWL XL LVL3 (GOWN DISPOSABLE)
KIT BASIN OR (CUSTOM PROCEDURE TRAY) ×1 IMPLANT
KIT TURNOVER KIT A (KITS) IMPLANT
MARKER SKIN DUAL TIP RULER LAB (MISCELLANEOUS) ×1 IMPLANT
MESH HERNIA 3X6 (Mesh General) IMPLANT
NEEDLE HYPO 22GX1.5 SAFETY (NEEDLE) ×1 IMPLANT
PACK BASIC VI WITH GOWN DISP (CUSTOM PROCEDURE TRAY) ×1 IMPLANT
PENCIL SMOKE EVACUATOR (MISCELLANEOUS) ×1 IMPLANT
SPIKE FLUID TRANSFER (MISCELLANEOUS) ×1 IMPLANT
SPONGE T-LAP 4X18 ~~LOC~~+RFID (SPONGE) ×1 IMPLANT
STRIP CLOSURE SKIN 1/2X4 (GAUZE/BANDAGES/DRESSINGS) IMPLANT
SUT MNCRL AB 4-0 PS2 18 (SUTURE) ×1 IMPLANT
SUT PDS AB 2-0 CT2 27 (SUTURE) IMPLANT
SUT PROLENE 2 0 CT2 30 (SUTURE) ×2 IMPLANT
SUT VIC AB 3-0 SH 18 (SUTURE) ×1 IMPLANT
SUT VIC AB 3-0 SH 27 (SUTURE) ×2
SUT VIC AB 3-0 SH 27XBRD (SUTURE) ×2 IMPLANT
SYR BULB IRRIG 60ML STRL (SYRINGE) ×1 IMPLANT
SYR CONTROL 10ML LL (SYRINGE) ×1 IMPLANT
TOWEL OR 17X26 10 PK STRL BLUE (TOWEL DISPOSABLE) ×1 IMPLANT
TOWEL OR NON WOVEN STRL DISP B (DISPOSABLE) ×1 IMPLANT
YANKAUER SUCT BULB TIP 10FT TU (MISCELLANEOUS) IMPLANT

## 2022-03-01 NOTE — Anesthesia Postprocedure Evaluation (Signed)
Anesthesia Post Note  Patient: Frederick Williams  Procedure(s) Performed: LEFT OPEN INGUINAL HERNIA REPAIR WITH MESH (Left)     Patient location during evaluation: PACU Anesthesia Type: General Level of consciousness: awake and alert Pain management: pain level controlled Vital Signs Assessment: post-procedure vital signs reviewed and stable Respiratory status: spontaneous breathing, nonlabored ventilation and respiratory function stable Cardiovascular status: blood pressure returned to baseline Postop Assessment: no apparent nausea or vomiting Anesthetic complications: no   No notable events documented.  Last Vitals:  Vitals:   03/01/22 1230 03/01/22 1245  BP: (!) 131/91 (!) 142/99  Pulse: 77 82  Resp: 15 12  Temp: (!) 36.4 C   SpO2: 98% 99%    Last Pain:  Vitals:   03/01/22 1230  TempSrc:   PainSc: [place]

## 2022-03-01 NOTE — Anesthesia Procedure Notes (Signed)
  Anesthesia Regional Block: TAP block   Pre-Anesthetic Checklist: , timeout performed,  Correct Patient, Correct Site, Correct Laterality,  Correct Procedure, Correct Position, site marked,  Risks and benefits discussed,  Pre-op evaluation,  At surgeon's request and post-op pain management  Laterality: Left  Prep: Maximum Sterile Barrier Precautions used, chloraprep       Needles:  Injection technique: Single-shot  Needle Type: Echogenic Stimulator Needle     Needle Length: 9cm  Needle Gauge: 21     Additional Needles:   Narrative:  Start time: 03/01/2022 10:01 AM End time: 03/01/2022 10:04 AM Injection made incrementally with aspirations every 5 mL. Anesthesiologist: Brennan Bailey, MD  Additional Notes: Risks, benefits, and alternative discussed. Patient gave consent for procedure. Patient prepped and draped in sterile fashion. Sedation administered, patient remains easily responsive to voice. Relevant anatomy identified with ultrasound guidance. Local anesthetic given in 5cc increments with no signs or symptoms of intravascular injection. No pain or paraesthesias with injection. Patient monitored throughout procedure with signs of LAST or immediate complications. Tolerated well. Ultrasound image placed in chart. Tawny Asal, MD

## 2022-03-01 NOTE — Op Note (Signed)
Preop diagnosis: left inguinal hernia  Postop diagnosis: left direct inguinal hernia  Procedure: open Left inguinal hernia repair with mesh  Surgeon: Gurney Maxin, M.D.  Asst: none  Anesthesia: Gen.   Indications for procedure: Frederick Williams is a 61 y.o. male with symptoms of pain and enlarging Left inguinal hernia(s). After discussing risks, alternatives and benefits he decided on open repair and was brought to day surgery for repair.  Description of procedure: The patient was brought into the operative suite, placed supine. Anesthesia was administered with endotracheal tube. Patient was strapped in place. The patient was prepped and draped in the usual sterile fashion.  The anterior superior iliac spine and pubic tubercle were identified on the Left side. An incision was made 1cm above the connecting line, representative of the location of the inguinal ligament. The subcutaneous tissue was bluntly dissected, scarpa's fascia was dissected away. The external abdominal oblique fascia was identified and sharply opened down to the external inguinal ring. The conjoint tendon and inguinal ligament were identified. The cord structures and sac were dissected free of the surrounding tissue in 360 degrees. A penrose drain was used to encircle the contents. The cremasteric fibers were dissected free of the contents of the cord and hernia sac. The cord structures (vessels and vas deferens) were identified and carefully dissected away from the hernia sac. The hernia sac was reduced and contained no visceral structures.The hernia sac was dissected down to the internal inguinal ring. Preperitoneal fat was identified showing appropriate dissection. The sac was then reduced into the preperitoneal space. The hernia was direct. The canal floor was large and closed with interrupted 2-0 PDS apposing the conjoint tendon to the inguinal ligament medially. A 3x6 Bard mesh was then used to close the defect and reinforce  the floor. The mesh was sutured to the lacunar ligament and inguinal ligament using a 2-0 prolene in running fashion. Next the superior edge of the mesh was sutured to the conjoined tendon using a 2-0 running Prolene. An additional 2-0 Prolene was used to suture the tail ends of the mesh together re-creating the deep ring. Cord structures are running in a neutral position through the mesh. Next the external abdominal oblique fascia was closed with a 2-0 Vicryl in interrupted fashion to re-create the external inguinal ring. Scarpa's fascia was closed with 3-0 Vicryl in running fashion. Skin was closed with a 4-0 Monocryl subcuticular stitch in running fashion. Dermabond place for dressing. Patient woke from anesthesia and brought to PACU in stable condition. All counts are correct.  Findings: left direct inguinal hernia  Specimen: none  Blood loss: 10 ml  Local anesthesia: 20 ml Marcaine with Epinephrine  Complications: none  Implant: 3 x 6 in Bard mesh  Gurney Maxin, M.D. General, Bariatric, & Minimally Invasive Surgery [facility], [place] 11:45 AM 03/01/2022

## 2022-03-01 NOTE — H&P (Signed)
  Chief Complaint: Inguinal Hernia   History of Present Illness: Frederick Williams is a 61 y.o. male who is seen today as an office consultation at the request of Dr. Lenna Gilford for evaluation of Inguinal Hernia .   He first noticed the hernia a few months ago. Symptoms are discomfort in certain positions. He denies nausea or vomiting or bowel habit change.  He does not smoke He does not diabetes He has no  Review of Systems: A complete review of systems was obtained from the patient. I have reviewed this information and discussed as appropriate with the patient. See HPI as well for other ROS.  Review of Systems  Constitutional: Negative.  HENT: Negative.  Eyes: Negative.  Respiratory: Negative.  Cardiovascular: Negative.  Gastrointestinal: Negative.  Genitourinary: Negative.  Musculoskeletal: Negative.  Skin: Negative.  Neurological: Negative.  Endo/Heme/Allergies: Negative.  Psychiatric/Behavioral: Negative.   Medical History: No past medical history on file. There is no problem list on file for this patient.  No past surgical history on file.  Allergies  Allergen Reactions  Penicillins Other (See Comments) and Rash   Current Outpatient Medications on File Prior to Visit  Medication Sig Dispense Refill  albuterol 90 mcg/actuation inhaler INHALE 2 PUFFS INTO THE LUNGS EVERY 4 HOURS   No current facility-administered medications on file prior to visit.   No family history on file.  Social History   Tobacco Use  Smoking Status Never  Smokeless Tobacco Never   Social History   Socioeconomic History  Marital status: Married  Tobacco Use  Smoking status: Never  Smokeless tobacco: Never  Substance and Sexual Activity  Alcohol use: Not Currently  Drug use: Never   Objective:   Vitals:  02/23/22 1417  BP: 112/78  Pulse: 84  Temp: 36.8 C (98.2 F)  Weight: 71.3 kg (157 lb 3.2 oz)  Height: 182.9 cm (6')   Body mass index is 21.32 kg/m.  Physical  Exam Constitutional:  Appearance: Normal appearance.  HENT:  Head: Normocephalic and atraumatic.  Pulmonary:  Effort: Pulmonary effort is normal.  Abdominal:  Comments: Moderate left inguinal hernia, no right inguinal hernia.  Musculoskeletal:  General: Normal range of motion.  Cervical back: Normal range of motion.  Neurological:  General: No focal deficit present.  Mental Status: He is alert and oriented to person, place, and time. Mental status is at baseline.  Psychiatric:  Mood and Affect: Mood normal.  Behavior: Behavior normal.  Thought Content: Thought content normal.     Labs, Imaging and Diagnostic Testing: I reviewed notes by Evelina Dun and Stevan Born  Assessment and Plan:  Diagnoses and all orders for this visit:  Non-recurrent unilateral inguinal hernia without obstruction or gangrene    We discussed etiology of hernias and how they can cause pain. We discussed options for inguinal hernia repair vs observation. We discussed details of the surgery of general anesthesia, surgical approach and incisions, dissecting the sack away from vas deference, testicular vessels and nerves and placement of mesh. We discussed risks of bleeding, infection, recurrence, injury to vas deference, testicular vessels, nerve injury, and chronic pain. He showed good understanding and wanted proceed with open left inguinal hernia repair as outpatient.

## 2022-03-01 NOTE — Anesthesia Procedure Notes (Signed)
Procedure Name: Intubation Date/Time: 03/01/2022 10:48 AM  Performed by: Victoriano Lain, CRNAPre-anesthesia Checklist: Patient identified, Emergency Drugs available, Suction available, Patient being monitored and Timeout performed Patient Re-evaluated:Patient Re-evaluated prior to induction Oxygen Delivery Method: Circle system utilized Preoxygenation: Pre-oxygenation with 100% oxygen Induction Type: IV induction Ventilation: Mask ventilation without difficulty Laryngoscope Size: Mac and 4 Grade View: Grade I Tube type: Oral Tube size: 7.5 mm Number of attempts: 1 Airway Equipment and Method: Stylet Placement Confirmation: ETT inserted through vocal cords under direct vision, positive ETCO2 and breath sounds checked- equal and bilateral Secured at: 22 cm Tube secured with: Tape Dental Injury: Teeth and Oropharynx as per pre-operative assessment

## 2022-03-01 NOTE — Discharge Instructions (Signed)
[facility] _______[facility]  UMBILICAL OR INGUINAL HERNIA REPAIR: POST OP INSTRUCTIONS  Always review your discharge instruction sheet given to you by the facility where your surgery was performed. IF YOU HAVE DISABILITY OR FAMILY LEAVE FORMS, YOU MUST BRING THEM TO THE OFFICE FOR PROCESSING.   DO NOT GIVE THEM TO YOUR DOCTOR.  1. A  prescription for pain medication may be given to you upon discharge.  Take your pain medication as prescribed, if needed.  If narcotic pain medicine is not needed, then you may take acetaminophen (Tylenol) or ibuprofen (Advil) as needed. 2. Take your usually prescribed medications unless otherwise directed. If you need a refill on your pain medication, please contact your pharmacy.  They will contact our office to request authorization. Prescriptions will not be filled after 5 pm or on week-ends. 3. You should follow a light diet the first 24 hours after arrival home, such as soup and crackers, etc.  Be sure to include lots of fluids daily.  Resume your normal diet the day after surgery. 4.Most patients will experience some swelling and bruising around the umbilicus or in the groin and scrotum.  Ice packs and reclining will help.  Swelling and bruising can take several days to resolve.  6. It is common to experience some constipation if taking pain medication after surgery.  Increasing fluid intake and taking a stool softener (such as Colace) will usually help or prevent this problem from occurring.  A mild laxative (Milk of Magnesia or Miralax) should be taken according to package directions if there are no bowel movements after 48 hours. 7. Unless discharge instructions indicate otherwise, you may remove your bandages 24-48 hours after surgery, and you may shower at that time.  You may have steri-strips (small skin tapes) in place directly over the incision.  These strips should be left on the skin for 7-10 days.  If your surgeon used skin glue on the  incision, you may shower in 24 hours.  The glue will flake off over the next 2-3 weeks.  Any sutures or staples will be removed at the office during your follow-up visit. 8. ACTIVITIES:  You may resume regular (light) daily activities beginning the next day--such as daily self-care, walking, climbing stairs--gradually increasing activities as tolerated.  You may have sexual intercourse when it is comfortable.  Refrain from any heavy lifting or straining until approved by your doctor.  a.You may drive when you are no longer taking prescription pain medication, you can comfortably wear a seatbelt, and you can safely maneuver your car and apply brakes. b.RETURN TO WORK:   _____________________________________________  9.You should see your doctor in the office for a follow-up appointment approximately 2-3 weeks after your surgery.  Make sure that you call for this appointment within a day or two after you arrive home to insure a convenient appointment time. 10.OTHER INSTRUCTIONS: _________________________    _____________________________________  WHEN TO CALL YOUR DOCTOR: Fever over 101.0 Inability to urinate Nausea and/or vomiting Extreme swelling or bruising Continued bleeding from incision. Increased pain, redness, or drainage from the incision  The clinic staff is available to answer your questions during regular business hours.  Please don't hesitate to call and ask to speak to one of the nurses for clinical concerns.  If you have a medical emergency, go to the nearest emergency room or call 911.  A surgeon from [facility] is always on call at the hospital   [address], [address],   [place], [place]  [address] ?  [address] [address], [place], [place]   [address] [phone] ? [phone] ? FAX [phone] Web site: [email or web address]  

## 2022-03-01 NOTE — Transfer of Care (Signed)
Immediate Anesthesia Transfer of Care Note  Patient: [name]  Procedure(s) Performed: LEFT OPEN INGUINAL HERNIA REPAIR WITH MESH (Left)  Patient Location: PACU  Anesthesia Type:General  Level of Consciousness: awake, alert , oriented and patient cooperative  Airway & Oxygen Therapy: Patient Spontanous Breathing and Patient connected to face mask oxygen  Post-op Assessment: Report given to RN, Post -op Vital signs reviewed and stable and Patient moving all extremities  Post vital signs: Reviewed and stable  Last Vitals:  Vitals Value Taken Time  BP 132/73 03/01/22 1200  Temp    Pulse 81 03/01/22 1202  Resp 17 03/01/22 1202  SpO2 100 % 03/01/22 1202  Vitals shown include unvalidated device data.  Last Pain:  Vitals:   03/01/22 0917  TempSrc:   PainSc: 1       Patients Stated Pain Goal: 0 (32/67/12 4580)  Complications: No notable events documented.

## 2022-03-02 ENCOUNTER — Encounter (HOSPITAL_COMMUNITY): Payer: Self-pay | Admitting: General Surgery

## 2022-05-05 ENCOUNTER — Other Ambulatory Visit: Payer: Self-pay | Admitting: Family

## 2022-05-21 ENCOUNTER — Encounter: Payer: Self-pay | Admitting: Family

## 2022-05-21 ENCOUNTER — Ambulatory Visit (INDEPENDENT_AMBULATORY_CARE_PROVIDER_SITE_OTHER): Payer: PPO | Admitting: Family

## 2022-05-21 VITALS — BP 135/87 | HR 72 | Temp 98.1°F | Ht 72.0 in | Wt 162.0 lb

## 2022-05-21 DIAGNOSIS — H6992 Unspecified Eustachian tube disorder, left ear: Secondary | ICD-10-CM | POA: Diagnosis not present

## 2022-05-21 MED ORDER — CETIRIZINE HCL 10 MG PO TABS: 10.0000 mg | ORAL_TABLET | Freq: Every day | ORAL | 1 refills | Status: DC

## 2022-05-21 MED ORDER — FLUTICASONE PROPIONATE 50 MCG/ACT NA SUSP: 2.0000 | Freq: Every day | NASAL | 6 refills | Status: DC

## 2022-05-21 NOTE — Progress Notes (Signed)
Subjective:    Patient ID: Frederick Williams, male    DOB: February 25, 1961, 62 y.o.   MRN: [id number]  Chief Complaint  Patient presents with   Ear Pain    Pain both ears    PT presents to the office today with bilateral ear pain that started yesterday. He took an "allergy pill" yesterday and his pain improved.  Otalgia  There is pain in both ears. This is a new problem. The current episode started yesterday. The problem has been waxing and waning. There has been no fever. The pain is at a severity of 8/10. The pain is mild. Associated symptoms include hearing loss and rhinorrhea. Pertinent negatives include no abdominal pain, coughing, ear discharge, headaches, neck pain or sore throat. Treatments tried: allergy. The treatment provided mild relief.      Review of Systems  HENT:  Positive for ear pain, hearing loss and rhinorrhea. Negative for ear discharge and sore throat.   Respiratory:  Negative for cough.   Gastrointestinal:  Negative for abdominal pain.  Musculoskeletal:  Negative for neck pain.  Neurological:  Negative for headaches.  All other systems reviewed and are negative.      Objective:   Physical Exam Vitals reviewed.  Constitutional:      General: He is not in acute distress.    Appearance: He is well-developed.  HENT:     Head: Normocephalic.     Right Ear: Tympanic membrane normal. No tenderness.     Left Ear: No tenderness. A middle ear effusion is present.  Eyes:     General:        Right eye: No discharge.        Left eye: No discharge.     Pupils: Pupils are equal, round, and reactive to light.  Neck:     Thyroid: No thyromegaly.  Cardiovascular:     Rate and Rhythm: Normal rate and regular rhythm.     Heart sounds: Normal heart sounds. No murmur heard. Pulmonary:     Effort: Pulmonary effort is normal. No respiratory distress.     Breath sounds: Normal breath sounds. No wheezing.  Abdominal:     General: Bowel sounds are normal. There is no distension.      Palpations: Abdomen is soft.     Tenderness: There is no abdominal tenderness.  Musculoskeletal:        General: No tenderness. Normal range of motion.     Cervical back: Normal range of motion and neck supple.  Skin:    General: Skin is warm and dry.     Findings: No erythema or rash.  Neurological:     Mental Status: He is alert and oriented to person, place, and time.     Cranial Nerves: No cranial nerve deficit.     Deep Tendon Reflexes: Reflexes are normal and symmetric.  Psychiatric:        Behavior: Behavior normal.        Thought Content: Thought content normal.        Judgment: Judgment normal.       BP 135/87   Pulse 72   Temp 98.1 F (36.7 C) (Temporal)   Ht 6' (1.829 m)   Wt 162 lb (73.5 kg)   SpO2 100%   BMI 21.97 kg/m      Assessment & Plan:   Frederick Williams comes in today with chief complaint of Ear Pain (Pain both ears )   Diagnosis and orders addressed:  1.  Disorder of left eustachian tube Start zyrtec and flonase Start nasal decongestant  Force fluids Follow up if symptoms worsen or do not improve  - fluticasone (FLONASE) 50 MCG/ACT nasal spray; Place 2 sprays into both nostrils daily.  Dispense: 16 g; Refill: 6 - cetirizine (ZYRTEC ALLERGY) 10 MG tablet; Take 1 tablet (10 mg total) by mouth daily.  Dispense: 90 tablet; Refill: [place], FNP

## 2022-05-21 NOTE — Patient Instructions (Signed)
Eustachian Tube Dysfunction  Eustachian tube dysfunction refers to a condition in which a blockage develops in the narrow passage that connects the middle ear to the back of the nose (eustachian tube). The eustachian tube regulates air pressure in the middle ear by letting air move between the ear and nose. It also helps to drain fluid from the middle ear space. Eustachian tube dysfunction can affect one or both ears. When the eustachian tube does not function properly, air pressure, fluid, or both can build up in the middle ear. What are the causes? This condition occurs when the eustachian tube becomes blocked or cannot open normally. Common causes of this condition include: Ear infections. Colds and other infections that affect the nose, mouth, and throat (upper respiratory tract). Allergies. Irritation from cigarette smoke. Irritation from stomach acid coming up into the esophagus (gastroesophageal reflux). The esophagus is the part of the body that moves food from the mouth to the stomach. Sudden changes in air pressure, such as from descending in an airplane or scuba diving. Abnormal growths in the nose or throat, such as: Growths that line the nose (nasal polyps). Abnormal growth of cells (tumors). Enlarged tissue at the back of the throat (adenoids). What increases the risk? You are more likely to develop this condition if: You smoke. You are overweight. You are a child who has: Certain birth defects of the mouth, such as cleft palate. Large tonsils or adenoids. What are the signs or symptoms? Common symptoms of this condition include: A feeling of fullness in the ear. Ear pain. Clicking or popping noises in the ear. Ringing in the ear (tinnitus). Hearing loss. Loss of balance. Dizziness. Symptoms may get worse when the air pressure around you changes, such as when you travel to an area of high elevation, fly on an airplane, or go scuba diving. How is this diagnosed? This  condition may be diagnosed based on: Your symptoms. A physical exam of your ears, nose, and throat. Tests, such as those that measure: The movement of your eardrum. Your hearing (audiometry). How is this treated? Treatment depends on the cause and severity of your condition. In mild cases, you may relieve your symptoms by moving air into your ears. This is called "popping the ears." In more severe cases, or if you have symptoms of fluid in your ears, treatment may include: Medicines to relieve congestion (decongestants). Medicines that treat allergies (antihistamines). Nasal sprays or ear drops that contain medicines that reduce swelling (steroids). A procedure to drain the fluid in your eardrum. In this procedure, a small tube may be placed in the eardrum to: Drain the fluid. Restore the air in the middle ear space. A procedure to insert a balloon device through the nose to inflate the opening of the eustachian tube (balloon dilation). Follow these instructions at home: Lifestyle Do not do any of the following until your health care provider approves: Travel to high altitudes. Fly in airplanes. Work in a pressurized cabin or room. Scuba dive. Do not use any products that contain nicotine or tobacco. These products include cigarettes, chewing tobacco, and vaping devices, such as e-cigarettes. If you need help quitting, ask your health care provider. Keep your ears dry. Wear fitted earplugs during showering and bathing. Dry your ears completely after. General instructions Take over-the-counter and prescription medicines only as told by your health care provider. Use techniques to help pop your ears as recommended by your health care provider. These may include: Chewing gum. Yawning. Frequent, forceful swallowing.   Closing your mouth, holding your nose closed, and gently blowing as if you are trying to blow air out of your nose. Keep all follow-up visits. This is important. Contact a  health care provider if: Your symptoms do not go away after treatment. Your symptoms come back after treatment. You are unable to pop your ears. You have: A fever. Pain in your ear. Pain in your head or neck. Fluid draining from your ear. Your hearing suddenly changes. You become very dizzy. You lose your balance. Get help right away if: You have a sudden, severe increase in any of your symptoms. Summary Eustachian tube dysfunction refers to a condition in which a blockage develops in the eustachian tube. It can be caused by ear infections, allergies, inhaled irritants, or abnormal growths in the nose or throat. Symptoms may include ear pain or fullness, hearing loss, or ringing in the ears. Mild cases are treated with techniques to unblock the ears, such as yawning or chewing gum. More severe cases are treated with medicines or procedures. This information is not intended to replace advice given to you by your health care provider. Make sure you discuss any questions you have with your health care provider. Document Revised: 07/04/2020 Document Reviewed: 07/04/2020 Elsevier Patient Education  2023 Elsevier Inc.  

## 2022-05-24 ENCOUNTER — Other Ambulatory Visit: Payer: Self-pay | Admitting: Family

## 2022-05-24 DIAGNOSIS — H6992 Unspecified Eustachian tube disorder, left ear: Secondary | ICD-10-CM

## 2022-05-24 MED ORDER — IPRATROPIUM BROMIDE 0.03 % NA SOLN: 2.0000 | Freq: Two times a day (BID) | NASAL | 12 refills | Status: DC

## 2022-07-13 ENCOUNTER — Other Ambulatory Visit: Payer: Self-pay | Admitting: General Surgery

## 2022-07-13 DIAGNOSIS — R1032 Left lower quadrant pain: Secondary | ICD-10-CM

## 2022-08-06 ENCOUNTER — Telehealth: Payer: Self-pay | Admitting: Family

## 2022-08-06 ENCOUNTER — Other Ambulatory Visit: Payer: Self-pay | Admitting: Family

## 2022-08-06 MED ORDER — ALBUTEROL SULFATE HFA 108 (90 BASE) MCG/ACT IN AERS: INHALATION_SPRAY | RESPIRATORY_TRACT | 2 refills | Status: DC

## 2022-08-06 NOTE — Telephone Encounter (Signed)
Patient called because he needed a refill on albuterol (VENTOLIN HFA) 108 (90 Base) MCG/ACT inhaler and he was made aware that he needs to make an appointment per notes on this medication. He did not want to make the appointment. Offered to let him speak to office manager and he hung up.

## 2022-08-10 ENCOUNTER — Inpatient Hospital Stay: Admission: RE | Admit: 2022-08-10 | Payer: PPO | Source: Ambulatory Visit

## 2022-08-16 ENCOUNTER — Telehealth: Payer: Self-pay | Admitting: Family

## 2022-08-16 NOTE — Telephone Encounter (Signed)
Contacted Frederick Williams to schedule their annual wellness visit. Patient declined to schedule AWV at this time.  Patient declined to schedule WTM on March 4,2024  Thank you,  [name],  AMB Clinical Support [facility] AWV Program Direct Dial ??[phone]

## 2022-08-31 ENCOUNTER — Encounter: Payer: Self-pay | Admitting: Family

## 2022-08-31 ENCOUNTER — Ambulatory Visit (INDEPENDENT_AMBULATORY_CARE_PROVIDER_SITE_OTHER): Payer: PPO | Admitting: Family

## 2022-08-31 VITALS — BP 120/80 | HR 106 | Temp 98.0°F | Ht 72.0 in | Wt 162.0 lb

## 2022-08-31 DIAGNOSIS — Z8616 Personal history of COVID-19: Secondary | ICD-10-CM | POA: Diagnosis not present

## 2022-08-31 DIAGNOSIS — Z0001 Encounter for general adult medical examination with abnormal findings: Secondary | ICD-10-CM

## 2022-08-31 DIAGNOSIS — R0609 Other forms of dyspnea: Secondary | ICD-10-CM | POA: Diagnosis not present

## 2022-08-31 DIAGNOSIS — J8410 Pulmonary fibrosis, unspecified: Secondary | ICD-10-CM | POA: Diagnosis not present

## 2022-08-31 DIAGNOSIS — Z0000 Encounter for general adult medical examination without abnormal findings: Secondary | ICD-10-CM

## 2022-08-31 MED ORDER — ALBUTEROL SULFATE HFA 108 (90 BASE) MCG/ACT IN AERS: INHALATION_SPRAY | RESPIRATORY_TRACT | 2 refills | Status: DC

## 2022-08-31 NOTE — Progress Notes (Signed)
Subjective:    Patient ID: Frederick Williams, male    DOB: March 16, 1961, 62 y.o.   MRN: [id number]  Chief Complaint  Patient presents with   Annual Exam   PT presents to the office today for CPE. He has hx of COVID pneumonia and has seen Pulmonologist and diagnosed with Postinflammatory pulmonary fibrosis. He completed pulmonary rehab. He continues to have SOB on exertion. Uses albuterol as needed that helps.   He has a CT abdomen scheduled for 10/10/22 for continued abdominal pain after left inguinal hernia.  Shortness of Breath This is a chronic problem. The current episode started more than 1 year ago. The problem occurs intermittently. The symptoms are aggravated by exercise.      Review of Systems  Respiratory:  Positive for shortness of breath.   All other systems reviewed and are negative.   Family History  Problem Relation Age of Onset   Dementia Mother    Hypertension Sister    Social History   Socioeconomic History   Marital status: Married    Spouse name: Not on file   Number of children: Not on file   Years of education: Not on file   Highest education level: Not on file  Occupational History   Not on file  Tobacco Use   Smoking status: Former    Packs/day: 2.00    Years: 32.00    Additional pack years: 0.00    Total pack years: 64.00    Types: Cigarettes    Quit date: 2013    Years since quitting: 11.3   Smokeless tobacco: Former    Quit date: 02/02/2011  Vaping Use   Vaping Use: Former  Substance and Sexual Activity   Alcohol use: Not Currently   Drug use: Not Currently    Types: Marijuana   Sexual activity: Not on file  Other Topics Concern   Not on file  Social History Narrative   Not on file   Social Determinants of Health   Financial Resource Strain: Not on file  Food Insecurity: Not on file  Transportation Needs: Not on file  Physical Activity: Not on file  Stress: Not on file  Social Connections: Not on file       Objective:    Physical Exam Vitals reviewed.  Constitutional:      General: He is not in acute distress.    Appearance: He is well-developed.  HENT:     Head: Normocephalic.     Right Ear: Tympanic membrane normal.     Left Ear: Tympanic membrane normal.  Eyes:     General:        Right eye: No discharge.        Left eye: No discharge.     Pupils: Pupils are equal, round, and reactive to light.  Neck:     Thyroid: No thyromegaly.  Cardiovascular:     Rate and Rhythm: Normal rate and regular rhythm.     Heart sounds: Normal heart sounds. No murmur heard. Pulmonary:     Effort: Pulmonary effort is normal. No respiratory distress.     Breath sounds: Normal breath sounds. No wheezing.  Abdominal:     General: Bowel sounds are normal. There is no distension.     Palpations: Abdomen is soft.     Tenderness: There is no abdominal tenderness.  Musculoskeletal:        General: No tenderness. Normal range of motion.     Cervical back: Normal range of motion  and neck supple.  Skin:    General: Skin is warm and dry.     Findings: No erythema or rash.  Neurological:     Mental Status: He is alert and oriented to person, place, and time.     Cranial Nerves: No cranial nerve deficit.     Deep Tendon Reflexes: Reflexes are normal and symmetric.  Psychiatric:        Behavior: Behavior normal.        Thought Content: Thought content normal.        Judgment: Judgment normal.      BP 120/80   Pulse (!) 106   Temp 98 F (36.7 C) (Temporal)   Ht 6' (1.829 m)   Wt 162 lb (73.5 kg)   SpO2 100%   BMI 21.97 kg/m      Assessment & Plan:   Frederick Williams comes in today with chief complaint of Annual Exam   Diagnosis and orders addressed:  1. Annual physical exam - CMP14+EGFR - CBC with Differential/Platelet - Lipid panel - PSA, total and free - TSH  2. History of COVID-19  - CMP14+EGFR - CBC with Differential/Platelet - albuterol (VENTOLIN HFA) 108 (90 Base) MCG/ACT inhaler; INHALE 1-2  PUFFS EVERY 2 HOURS AS NEED FOR WHEEZING OR SHORT BREATH. (NEEDS SEEN BEFORE NEXT REFILL)  Dispense: 18 each; Refill: 2  3. Dyspnea on exertion - CMP14+EGFR - CBC with Differential/Platelet - albuterol (VENTOLIN HFA) 108 (90 Base) MCG/ACT inhaler; INHALE 1-2 PUFFS EVERY 2 HOURS AS NEED FOR WHEEZING OR SHORT BREATH. (NEEDS SEEN BEFORE NEXT REFILL)  Dispense: 18 each; Refill: 2  4. Postinflammatory pulmonary fibrosis (HCC)  - CMP14+EGFR - CBC with Differential/Platelet - albuterol (VENTOLIN HFA) 108 (90 Base) MCG/ACT inhaler; INHALE 1-2 PUFFS EVERY 2 HOURS AS NEED FOR WHEEZING OR SHORT BREATH. (NEEDS SEEN BEFORE NEXT REFILL)  Dispense: 18 each; Refill: 2   Labs pending Health Maintenance reviewed Diet and exercise encouraged  Follow up plan: 1 year    Jannifer Rodney, FNP

## 2022-08-31 NOTE — Patient Instructions (Signed)
Health Maintenance, Male Adopting a healthy lifestyle and getting preventive care are important in promoting health and wellness. Ask your health care provider about: The right schedule for you to have regular tests and exams. Things you can do on your own to prevent diseases and keep yourself healthy. What should I know about diet, weight, and exercise? Eat a healthy diet  Eat a diet that includes plenty of vegetables, fruits, low-fat dairy products, and lean protein. Do not eat a lot of foods that are high in solid fats, added sugars, or sodium. Maintain a healthy weight Body mass index (BMI) is a measurement that can be used to identify possible weight problems. It estimates body fat based on height and weight. Your health care provider can help determine your BMI and help you achieve or maintain a healthy weight. Get regular exercise Get regular exercise. This is one of the most important things you can do for your health. Most adults should: Exercise for at least 150 minutes each week. The exercise should increase your heart rate and make you sweat (moderate-intensity exercise). Do strengthening exercises at least twice a week. This is in addition to the moderate-intensity exercise. Spend less time sitting. Even light physical activity can be beneficial. Watch cholesterol and blood lipids Have your blood tested for lipids and cholesterol at 62 years of age, then have this test every 5 years. You may need to have your cholesterol levels checked more often if: Your lipid or cholesterol levels are high. You are older than 62 years of age. You are at high risk for heart disease. What should I know about cancer screening? Many types of cancers can be detected early and may often be prevented. Depending on your health history and family history, you may need to have cancer screening at various ages. This may include screening for: Colorectal cancer. Prostate cancer. Skin cancer. Lung  cancer. What should I know about heart disease, diabetes, and high blood pressure? Blood pressure and heart disease High blood pressure causes heart disease and increases the risk of stroke. This is more likely to develop in people who have high blood pressure readings or are overweight. Talk with your health care provider about your target blood pressure readings. Have your blood pressure checked: Every 3-5 years if you are 18-39 years of age. Every year if you are 40 years old or older. If you are between the ages of 65 and 75 and are a current or former smoker, ask your health care provider if you should have a one-time screening for abdominal aortic aneurysm (AAA). Diabetes Have regular diabetes screenings. This checks your fasting blood sugar level. Have the screening done: Once every three years after age 45 if you are at a normal weight and have a low risk for diabetes. More often and at a younger age if you are overweight or have a high risk for diabetes. What should I know about preventing infection? Hepatitis B If you have a higher risk for hepatitis B, you should be screened for this virus. Talk with your health care provider to find out if you are at risk for hepatitis B infection. Hepatitis C Blood testing is recommended for: Everyone born from 1945 through 1965. Anyone with known risk factors for hepatitis C. Sexually transmitted infections (STIs) You should be screened each year for STIs, including gonorrhea and chlamydia, if: You are sexually active and are younger than 62 years of age. You are older than 62 years of age and your   health care provider tells you that you are at risk for this type of infection. Your sexual activity has changed since you were last screened, and you are at increased risk for chlamydia or gonorrhea. Ask your health care provider if you are at risk. Ask your health care provider about whether you are at high risk for HIV. Your health care provider  may recommend a prescription medicine to help prevent HIV infection. If you choose to take medicine to prevent HIV, you should first get tested for HIV. You should then be tested every 3 months for as long as you are taking the medicine. Follow these instructions at home: Alcohol use Do not drink alcohol if your health care provider tells you not to drink. If you drink alcohol: Limit how much you have to 0-2 drinks a day. Know how much alcohol is in your drink. In the [place] one drink equals one 12 oz bottle of beer (355 mL), one 5 oz glass of wine (148 mL), or one 1 oz glass of hard liquor (44 mL). Lifestyle Do not use any products that contain nicotine or tobacco. These products include cigarettes, chewing tobacco, and vaping devices, such as e-cigarettes. If you need help quitting, ask your health care provider. Do not use street drugs. Do not share needles. Ask your health care provider for help if you need support or information about quitting drugs. General instructions Schedule regular health, dental, and eye exams. Stay current with your vaccines. Tell your health care provider if: You often feel depressed. You have ever been abused or do not feel safe at home. Summary Adopting a healthy lifestyle and getting preventive care are important in promoting health and wellness. Follow your health care provider's instructions about healthy diet, exercising, and getting tested or screened for diseases. Follow your health care provider's instructions on monitoring your cholesterol and blood pressure. This information is not intended to replace advice given to you by your health care provider. Make sure you discuss any questions you have with your health care provider. Document Revised: 09/12/2020 Document Reviewed: 09/12/2020 Elsevier Patient Education  2023 Elsevier Inc.  

## 2022-09-01 LAB — CBC WITH DIFFERENTIAL/PLATELET
Basophils Absolute: 0 10*3/uL (ref 0.0–0.2)
Basos: 0 %
EOS (ABSOLUTE): 0.1 10*3/uL (ref 0.0–0.4)
Eos: 1 %
Hematocrit: 46.1 % (ref 37.5–51.0)
Hemoglobin: 15.7 g/dL (ref 13.0–17.7)
Immature Grans (Abs): 0 10*3/uL (ref 0.0–0.1)
Immature Granulocytes: 0 %
Lymphocytes Absolute: 1 10*3/uL (ref 0.7–3.1)
Lymphs: 23 %
MCH: 30.8 pg (ref 26.6–33.0)
MCHC: 34.1 g/dL (ref 31.5–35.7)
MCV: 91 fL (ref 79–97)
Monocytes Absolute: 0.4 10*3/uL (ref 0.1–0.9)
Monocytes: 9 %
Neutrophils Absolute: 3.1 10*3/uL (ref 1.4–7.0)
Neutrophils: 67 %
Platelets: 183 10*3/uL (ref 150–450)
RBC: 5.09 x10E6/uL (ref 4.14–5.80)
RDW: 12.6 % (ref 11.6–15.4)
WBC: 4.6 10*3/uL (ref 3.4–10.8)

## 2022-09-01 LAB — CMP14+EGFR
ALT: 13 IU/L (ref 0–44)
AST: 16 IU/L (ref 0–40)
Albumin/Globulin Ratio: 2.4 — ABNORMAL HIGH (ref 1.2–2.2)
Albumin: 4.4 g/dL (ref 3.9–4.9)
Alkaline Phosphatase: 83 IU/L (ref 44–121)
BUN/Creatinine Ratio: 13 (ref 10–24)
BUN: 17 mg/dL (ref 8–27)
Bilirubin Total: 0.4 mg/dL (ref 0.0–1.2)
CO2: 21 mmol/L (ref 20–29)
Calcium: 9.2 mg/dL (ref 8.6–10.2)
Chloride: 104 mmol/L (ref 96–106)
Creatinine, Ser: 1.26 mg/dL (ref 0.76–1.27)
Globulin, Total: 1.8 g/dL (ref 1.5–4.5)
Glucose: 82 mg/dL (ref 70–99)
Potassium: 5.1 mmol/L (ref 3.5–5.2)
Sodium: 142 mmol/L (ref 134–144)
Total Protein: 6.2 g/dL (ref 6.0–8.5)
eGFR: 65 mL/min/{1.73_m2} (ref >59)

## 2022-09-01 LAB — LIPID PANEL
Chol/HDL Ratio: 4.1 ratio (ref 0.0–5.0)
Cholesterol, Total: 176 mg/dL (ref 100–199)
HDL: 43 mg/dL (ref >39)
LDL Chol Calc (NIH): 113 mg/dL — ABNORMAL HIGH (ref 0–99)
Triglycerides: 107 mg/dL (ref 0–149)
VLDL Cholesterol Cal: 20 mg/dL (ref 5–40)

## 2022-09-01 LAB — TSH: TSH: 0.714 u[IU]/mL (ref 0.450–4.500)

## 2022-09-01 LAB — PSA, TOTAL AND FREE
PSA, Free Pct: 43 %
PSA, Free: 0.86 ng/mL
Prostate Specific Ag, Serum: 2 ng/mL (ref 0.0–4.0)

## 2022-09-03 ENCOUNTER — Other Ambulatory Visit: Payer: Self-pay | Admitting: Family

## 2022-09-03 MED ORDER — ROSUVASTATIN CALCIUM 5 MG PO TABS: 5.0000 mg | ORAL_TABLET | Freq: Every day | ORAL | 3 refills | Status: DC

## 2022-10-06 ENCOUNTER — Ambulatory Visit (INDEPENDENT_AMBULATORY_CARE_PROVIDER_SITE_OTHER): Payer: PPO

## 2022-10-06 ENCOUNTER — Ambulatory Visit
Admission: EM | Admit: 2022-10-06 | Discharge: 2022-10-06 | Disposition: A | Discharge status: Home / Self Care | Payer: PPO | Attending: Nurse Practitioner | Admitting: Nurse Practitioner

## 2022-10-06 DIAGNOSIS — S61314A Laceration without foreign body of right ring finger with damage to nail, initial encounter: Secondary | ICD-10-CM | POA: Diagnosis not present

## 2022-10-06 DIAGNOSIS — Z23 Encounter for immunization: Secondary | ICD-10-CM | POA: Diagnosis not present

## 2022-10-06 DIAGNOSIS — S62634A Displaced fracture of distal phalanx of right ring finger, initial encounter for closed fracture: Secondary | ICD-10-CM | POA: Diagnosis not present

## 2022-10-06 DIAGNOSIS — S62639B Displaced fracture of distal phalanx of unspecified finger, initial encounter for open fracture: Secondary | ICD-10-CM

## 2022-10-06 MED ORDER — TETANUS-DIPHTH-ACELL PERTUSSIS 5-2.5-18.5 LF-MCG/0.5 IM SUSY
0.5000 mL | PREFILLED_SYRINGE | Freq: Once | INTRAMUSCULAR | Status: AC
  Administered 2022-10-06: 0.5 mL via INTRAMUSCULAR

## 2022-10-06 NOTE — ED Provider Notes (Signed)
[facility]    CSN: [id number] Arrival date & time: 10/06/22  1322      History   Chief Complaint Chief Complaint  Patient presents with   Finger Injury    HPI Frederick Williams is a 62 y.o. male.   Patient presents today with right fourth digit finger and nail injury.  Reports he has a cut to his finger that is bleeding and he has almost ripped the nail of his finger.  Reports he was working on his lawnmower when the deck started falling towards his hand and he pulled his hand out from under the deck.  Reports he was working over pebbles when the injury occurred and thinks one of the pebbles may have caught on his finger.  He denies numbness or tingling in the fingertip or significant pain in the fingertip.  Reports the area has been bleeding a lot.    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 Authorizing Provider  rosuvastatin (CRESTOR) 5 MG tablet Take 1 tablet (5 mg total) by mouth daily. 09/03/22   Jannifer Rodney A, FNP  albuterol (VENTOLIN HFA) 108 (90 Base) MCG/ACT inhaler INHALE 1-2 PUFFS EVERY 2 HOURS AS NEED FOR WHEEZING OR SHORT BREATH. (NEEDS SEEN BEFORE NEXT REFILL) 08/31/22   Junie Spencer, FNP  cetirizine (ZYRTEC ALLERGY) 10 MG tablet Take 1 tablet (10 mg total) by mouth daily. 05/21/22   Junie Spencer, FNP  Ensure (ENSURE) Take 237 mLs by mouth daily.    [provider]    Family History Family History  Problem Relation Age of Onset   Dementia Mother    Hypertension Sister     Social History Social History   Tobacco Use   Smoking status: Former    Packs/day: 2.00    Years: 32.00    Additional pack years: 0.00    Total pack years: 64.00    Types: Cigarettes    Quit date: 2013    Years since quitting: 11.4   Smokeless tobacco: Former    Quit date: 02/02/2011  Vaping Use   Vaping Use: Former  Substance Use Topics   Alcohol use: Not Currently   Drug use: Not Currently    Types: Marijuana     Allergies   Ciprofloxacin, Prednisone, and Penicillins   Review of Systems Review of Systems Per HPI  Physical Exam Triage Vital Signs ED Triage Vitals  Enc Vitals Group     BP 10/06/22 1333 132/84     Pulse Rate 10/06/22 1333 82     Resp 10/06/22 1333 16     Temp 10/06/22 1333 97.9 F (36.6 C)     Temp Source 10/06/22 1333 Oral     SpO2 10/06/22 1333 98 %     Weight --      Height --  Head Circumference --      Peak Flow --      Pain Score 10/06/22 1331 0     Pain Loc --      Pain Edu? --      Excl. in GC? --    No data found.  Updated Vital Signs BP 132/84 (BP Location: Left Arm)   Pulse 82   Temp 97.9 F (36.6 C) (Oral)   Resp 16   SpO2 98%   Visual Acuity Right Eye Distance:   Left Eye Distance:   Bilateral Distance:    Right Eye Near:   Left Eye Near:    Bilateral Near:     Physical Exam Vitals and nursing note reviewed.  Constitutional:      General: He is not in acute distress.    Appearance: Normal appearance. He is not toxic-appearing.  Pulmonary:     Effort: Pulmonary effort is normal. No respiratory distress.  Musculoskeletal:     Right lower leg: No edema.     Left lower leg: No edema.     Comments: Approximately 1 cm laceration to the medial aspect of the tip of the left ring finger; fingernail is almost completely avulsed; there is bleeding coming from under the fingernail and  from the laceration  Skin:    General: Skin is warm and dry.     Capillary Refill: Capillary refill takes less than 2 seconds.     Coloration: Skin is not jaundiced or pale.     Findings: No erythema.  Neurological:     Mental Status: He is alert and oriented to person, place, and time.  Psychiatric:        Behavior: Behavior is cooperative.      UC Treatments / Results  Labs (all labs ordered are listed, but only abnormal results are displayed) Labs Reviewed - No data to display  EKG   Radiology DG Finger Ring Right  Result Date: 10/06/2022 CLINICAL DATA:  Ring finger injury EXAM: RIGHT RING FINGER 2+V COMPARISON:  None Available. FINDINGS: Acute comminuted, minimally displaced fracture through the distal tuft of the ring finger distal phalanx. No additional fractures. No dislocation. Overlying soft tissue irregularity. IMPRESSION: Acute comminuted, minimally displaced open fracture through the distal tuft of the ring finger distal phalanx. Electronically Signed   By: Duanne Guess D.O.   On: 10/06/2022 14:06    Procedures Laceration Repair  Date/Time: 10/06/2022 3:43 PM  Performed by: Valentino Nose, NP Authorized by: Valentino Nose, NP   Consent:    Consent obtained:  Verbal   Consent given by:  Patient   Risks, benefits, and alternatives were discussed: yes     Risks discussed:  Infection, pain, tendon damage, vascular damage and poor wound healing   Alternatives discussed:  Delayed treatment and referral Universal protocol:    Procedure explained and questions answered to patient or proxy's satisfaction: yes     Patient identity confirmed:  Verbally with patient Anesthesia:    Anesthesia method:  None Laceration details:    Location:  Finger   Finger location:  R ring finger   Length (cm):  1 Exploration:    Hemostasis achieved with:  Direct pressure   Imaging obtained: x-ray     Imaging outcome: foreign body not noted     Wound exploration: wound  explored through full range of motion   Treatment:    Area cleansed with:  Chlorhexidine   Amount of cleaning:  Standard   Irrigation  solution:  Sterile saline   Irrigation volume:  10 mL   Irrigation method:  Syringe Skin repair:    Repair method:  Tissue adhesive Approximation:    Approximation:  Close Repair type:    Repair type:  Simple Post-procedure details:    Dressing:  Splint for protection   Procedure completion:  Tolerated well, no immediate complications  (including critical care time)  Medications Ordered in UC Medications  Tdap (BOOSTRIX) injection 0.5 mL (0.5 mLs Intramuscular Given 10/06/22 1358)    Initial Impression / Assessment and Plan / UC Course  I have reviewed the triage vital signs and the nursing notes.  Pertinent labs & imaging results that were available during my care of the patient were reviewed by me and considered in my medical decision making (see chart for details).   Patient is well-appearing, normotensive, afebrile, not tachycardic, not tachypneic, oxygenating well on room air.    1. Open fracture of tuft of distal phalanx of finger X-ray imaging today shows acute comminuted and minimally displaced open fracture of the distal phalanx I recommended further evaluation and management in emergency room, however patient declines Tdap updated Wound care discussed and applied today Splint applied Recommended close follow-up with hand surgery; seek care in ER if symptoms worsen in the meantime  2. Laceration of right ring finger without foreign body with damage to nail, initial encounter I recommended further evaluation and management in the emergency room, however patient declines Laceration repair as above Wound care discussed and finger splint applied Recommended close follow-up with hand surgery; seek care in ER if symptoms worsen in the meantime  The patient was given the opportunity to ask questions.  All questions answered to their  satisfaction.  The patient is in agreement to this plan.    Final Clinical Impressions(s) / UC Diagnoses   Final diagnoses:  Open fracture of tuft of distal phalanx of finger  Laceration of right ring finger without foreign body with damage to nail, initial encounter     Discharge Instructions      The xray today shows a fracture through the tip of your ring finger.  This is considered an open fracture and can be very susceptible to infection and poor wound healing.  Please keep the area clean and dry.  We have closed the wound with skin glue today - this will likely fall off on its own in a few days as the skin heals.  The nail will also likely fall off on its own in a few days.  Please wear the finger splint to help protect the skin and help the bone heal.    Tdap has been updated today.  I recommended evaluation in the ER today and you declined this.  Please follow up with a Hand Surgeon ASAP; contact information has been provided. If finger pain or swelling worsens, please go to the ER.     ED Prescriptions   None    PDMP not reviewed this encounter.   Valentino Nose, NP 10/06/22 1544

## 2022-10-06 NOTE — Discharge Instructions (Addendum)
The xray today shows a fracture through the tip of your ring finger.  This is considered an open fracture and can be very susceptible to infection and poor wound healing.  Please keep the area clean and dry.  We have closed the wound with skin glue today - this will likely fall off on its own in a few days as the skin heals.  The nail will also likely fall off on its own in a few days.  Please wear the finger splint to help protect the skin and help the bone heal.    Tdap has been updated today.  I recommended evaluation in the ER today and you declined this.  Please follow up with a Hand Surgeon ASAP; contact information has been provided. If finger pain or swelling worsens, please go to the ER.

## 2022-10-06 NOTE — ED Triage Notes (Signed)
Pt states a lawnmower deck fell on his right 4th digit which ripped his nail almost off and he also  has a small laceration on the tip of his finger.

## 2022-10-10 ENCOUNTER — Ambulatory Visit
Admission: RE | Admit: 2022-10-10 | Discharge: 2022-10-10 | Disposition: A | Discharge status: Home / Self Care | Payer: PPO | Source: Ambulatory Visit | Attending: General Surgery | Admitting: General Surgery

## 2022-10-10 DIAGNOSIS — R1032 Left lower quadrant pain: Secondary | ICD-10-CM

## 2022-10-10 DIAGNOSIS — I700 Atherosclerosis of aorta: Secondary | ICD-10-CM | POA: Diagnosis not present

## 2022-10-10 MED ORDER — IOPAMIDOL (ISOVUE-300) INJECTION 61%
100.0000 mL | Freq: Once | INTRAVENOUS | Status: AC | PRN
  Administered 2022-10-10: 100 mL via INTRAVENOUS

## 2022-10-25 DIAGNOSIS — R1032 Left lower quadrant pain: Secondary | ICD-10-CM | POA: Diagnosis not present

## 2023-01-21 ENCOUNTER — Other Ambulatory Visit: Payer: Self-pay | Admitting: Family

## 2023-01-21 DIAGNOSIS — Z8616 Personal history of COVID-19: Secondary | ICD-10-CM

## 2023-01-21 DIAGNOSIS — J8410 Pulmonary fibrosis, unspecified: Secondary | ICD-10-CM

## 2023-01-21 DIAGNOSIS — R0609 Other forms of dyspnea: Secondary | ICD-10-CM

## 2023-02-03 DIAGNOSIS — Z8616 Personal history of COVID-19: Secondary | ICD-10-CM

## 2023-02-03 DIAGNOSIS — R0609 Other forms of dyspnea: Secondary | ICD-10-CM

## 2023-02-03 DIAGNOSIS — J8410 Pulmonary fibrosis, unspecified: Secondary | ICD-10-CM

## 2023-02-04 MED ORDER — ALBUTEROL SULFATE HFA 108 (90 BASE) MCG/ACT IN AERS
INHALATION_SPRAY | RESPIRATORY_TRACT | 2 refills | Status: DC
Start: 2023-02-04 — End: 2023-04-29

## 2023-04-01 ENCOUNTER — Encounter: Payer: Self-pay | Admitting: Family Medicine

## 2023-04-01 ENCOUNTER — Ambulatory Visit (INDEPENDENT_AMBULATORY_CARE_PROVIDER_SITE_OTHER): Payer: PPO | Admitting: Family Medicine

## 2023-04-01 VITALS — BP 107/63 | HR 67 | Temp 98.0°F | Ht 72.0 in | Wt 156.4 lb

## 2023-04-01 DIAGNOSIS — R0989 Other specified symptoms and signs involving the circulatory and respiratory systems: Secondary | ICD-10-CM | POA: Diagnosis not present

## 2023-04-01 DIAGNOSIS — J3489 Other specified disorders of nose and nasal sinuses: Secondary | ICD-10-CM

## 2023-04-01 LAB — VERITOR FLU A/B WAIVED
Influenza A: NEGATIVE
Influenza B: NEGATIVE

## 2023-04-01 MED ORDER — LEVOCETIRIZINE DIHYDROCHLORIDE 5 MG PO TABS
5.0000 mg | ORAL_TABLET | Freq: Every evening | ORAL | 3 refills | Status: DC
Start: 2023-04-01 — End: 2023-05-20

## 2023-04-01 NOTE — Progress Notes (Signed)
Acute Office Visit  Subjective:     Patient ID: Frederick Williams, male    DOB: 05/07/1961, 62 y.o.   MRN: [id number]  Chief Complaint  Patient presents with   sinus pressure    Sinusitis This is a new problem. Episode onset: 2 days. There has been no fever. Associated symptoms include congestion, coughing, headaches and sinus pressure (frontal, ethmoid). Pertinent negatives include no chills, diaphoresis, ear pain, shortness of breath, sneezing, sore throat or swollen glands. Past treatments include nothing. The treatment provided no relief.   Had negative home Covid test yesterday. He is requesting a flu test today.   Review of Systems  Constitutional:  Negative for chills and diaphoresis.  HENT:  Positive for congestion and sinus pressure (frontal, ethmoid). Negative for ear pain, sneezing and sore throat.   Respiratory:  Positive for cough. Negative for shortness of breath.   Neurological:  Positive for headaches.        Objective:    BP 107/63   Pulse 67   Temp 98 F (36.7 C) (Temporal)   Ht 6' (1.829 m)   Wt 156 lb 6 oz (70.9 kg)   SpO2 99%   BMI 21.21 kg/m    Physical Exam Vitals and nursing note reviewed.  Constitutional:      General: He is not in acute distress.    Appearance: Normal appearance. He is not ill-appearing, toxic-appearing or diaphoretic.  HENT:     Head: Normocephalic and atraumatic.     Right Ear: Tympanic membrane, ear canal and external ear normal.     Left Ear: Tympanic membrane, ear canal and external ear normal.     Nose: Rhinorrhea present.     Mouth/Throat:     Mouth: Mucous membranes are moist.     Pharynx: Oropharynx is clear. No oropharyngeal exudate or posterior oropharyngeal erythema.  Eyes:     General:        Right eye: No discharge.        Left eye: No discharge.     Conjunctiva/sclera: Conjunctivae normal.  Cardiovascular:     Rate and Rhythm: Normal rate and regular rhythm.     Heart sounds: Normal heart sounds. No  murmur heard. Pulmonary:     Effort: Pulmonary effort is normal. No respiratory distress.     Breath sounds: Normal breath sounds. No wheezing, rhonchi or rales.  Musculoskeletal:        General: Normal range of motion.     Cervical back: Neck supple. No rigidity.  Skin:    General: Skin is warm and dry.  Neurological:     General: No focal deficit present.     Mental Status: He is alert and oriented to person, place, and time.  Psychiatric:        Mood and Affect: Mood normal.        Behavior: Behavior normal.     No results found for any visits on 04/01/23.      Assessment & Plan:   Frederick Williams was seen today for sinus pressure.  Diagnoses and all orders for this visit:  Sinus pressure Negative flu today. Negative home Covid. Start xyzal as below. Return to office for new or worsening symptoms, or if symptoms persist.  -     Veritor Flu A/B Waived -     levocetirizine (XYZAL) 5 MG tablet; Take 1 tablet (5 mg total) by mouth every evening.   The patient indicates understanding of these issues and agrees with the  plan.  Gabriel Earing, FNP

## 2023-04-23 ENCOUNTER — Encounter: Payer: Self-pay | Admitting: Family Medicine

## 2023-04-23 ENCOUNTER — Telehealth: Payer: Self-pay | Admitting: Family Medicine

## 2023-04-23 ENCOUNTER — Telehealth (INDEPENDENT_AMBULATORY_CARE_PROVIDER_SITE_OTHER): Payer: PPO | Admitting: Family Medicine

## 2023-04-23 DIAGNOSIS — U071 COVID-19: Secondary | ICD-10-CM

## 2023-04-23 MED ORDER — NIRMATRELVIR/RITONAVIR (PAXLOVID)TABLET: 3.0000 | ORAL_TABLET | Freq: Two times a day (BID) | ORAL | 0 refills | Status: AC

## 2023-04-23 NOTE — Telephone Encounter (Unsigned)
Copied from CRM [id number]. Topic: Clinical - Medical Advice >> Apr 23, 2023 11:12 AM Theodis Sato wrote: Reason for CRM: PT took a positive Covid test and would like to know if Jannifer Rodney can prescribe him medication or if he can take and expired unopened bottle of azithromycin 250 MG- PT states Jannifer Rodney can respond through my chart.

## 2023-04-23 NOTE — Progress Notes (Signed)
Subjective:    Patient ID: Frederick Williams, male    DOB: [birth year], 62 y.o.   MRN: [id number]   HPI: Frederick Williams is a 62 y.o. male presenting for sore throat, nasal drainage onset yesterday. Can't concentrate. Positive for Covid via home test earlier today. Denies fever and dyspnea. Some chest pressure. Some chills earlier     05/21/2022    9:20 AM 06/29/2019   12:39 PM 06/29/2019   12:38 PM 04/10/2018    9:49 AM 04/05/2017   11:51 AM  Depression screen PHQ 2/9  Decreased Interest 0  0 0 0  Down, Depressed, Hopeless 0  0 0 0  PHQ - 2 Score 0  0 0 0  Altered sleeping 0 0     Tired, decreased energy 0 0     Change in appetite 0 0     Feeling bad or failure about yourself  0 0     Trouble concentrating 0 0     Moving slowly or fidgety/restless 0 0     Suicidal thoughts 0 0     PHQ-9 Score 0      Difficult doing work/chores Not difficult at all Not difficult at all        Relevant past medical, surgical, family and social history reviewed and updated as indicated.  Interim medical history since our last visit reviewed. Allergies and medications reviewed and updated.  ROS:  Review of Systems  Constitutional:  Positive for chills and fatigue. Negative for fever.  Respiratory:  Negative for shortness of breath.   Cardiovascular:  Negative for chest pain.  Musculoskeletal:  Negative for arthralgias.  Skin:  Negative for rash.  Psychiatric/Behavioral:  Positive for decreased concentration (brain fog).      Social History   Tobacco Use  Smoking Status Former   Current packs/day: 0.00   Average packs/day: 2.0 packs/day for 32.0 years (64.0 ttl pk-yrs)   Types: Cigarettes   Start date: 41   Quit date: 2013   Years since quitting: 11.9  Smokeless Tobacco Former   Quit date: 02/02/2011       Objective:     Wt Readings from Last 3 Encounters:  04/01/23 156 lb 6 oz (70.9 kg)  08/31/22 162 lb (73.5 kg)  05/21/22 162 lb (73.5 kg)     Exam deferred. Video visit  performed.   Assessment & Plan:   1. COVID-19 virus infection     Meds ordered this encounter  Medications   nirmatrelvir/ritonavir (PAXLOVID) 20 x 150 MG & 10 x 100MG  TABS    Sig: Take 3 tablets by mouth 2 (two) times daily for 5 days. (Take nirmatrelvir 150 mg two tablets twice daily for 5 days and ritonavir 100 mg one tablet twice daily for 5 days) Patient GFR is 65    Dispense:  30 tablet    Refill:  0    No orders of the defined types were placed in this encounter.     Diagnoses and all orders for this visit:  COVID-19 virus infection  Other orders -     nirmatrelvir/ritonavir (PAXLOVID) 20 x 150 MG & 10 x 100MG  TABS; Take 3 tablets by mouth 2 (two) times daily for 5 days. (Take nirmatrelvir 150 mg two tablets twice daily for 5 days and ritonavir 100 mg one tablet twice daily for 5 days) Patient GFR is 65    Virtual Visit  Note  I discussed the limitations, risks, security and privacy concerns of  performing an evaluation and management service by video and the availability of in person appointments. The patient was identified with two identifiers. Pt.expressed understanding and agreed to proceed. Pt. Is at home. Dr. Darlyn Read is in his office.  Follow Up Instructions:   I discussed the assessment and treatment plan with the patient. The patient was provided an opportunity to ask questions and all were answered. The patient agreed with the plan and demonstrated an understanding of the instructions.   The patient was advised to call back or seek an in-person evaluation if the symptoms worsen or if the condition fails to improve as anticipated.   Total minutes contact time: 16   Follow up plan: Return if symptoms worsen or fail to improve.  Mechele Claude, MD [facility]

## 2023-04-28 ENCOUNTER — Other Ambulatory Visit: Payer: Self-pay | Admitting: Family

## 2023-04-28 DIAGNOSIS — R0609 Other forms of dyspnea: Secondary | ICD-10-CM

## 2023-04-28 DIAGNOSIS — Z8616 Personal history of COVID-19: Secondary | ICD-10-CM

## 2023-04-28 DIAGNOSIS — J8410 Pulmonary fibrosis, unspecified: Secondary | ICD-10-CM

## 2023-05-20 ENCOUNTER — Encounter: Payer: Self-pay | Admitting: Family Medicine

## 2023-05-20 ENCOUNTER — Ambulatory Visit: Payer: Self-pay | Admitting: Family

## 2023-05-20 ENCOUNTER — Ambulatory Visit (INDEPENDENT_AMBULATORY_CARE_PROVIDER_SITE_OTHER): Payer: PPO | Admitting: Family Medicine

## 2023-05-20 VITALS — BP 119/70 | HR 81 | Temp 98.3°F | Ht 72.0 in | Wt 158.0 lb

## 2023-05-20 DIAGNOSIS — B9689 Other specified bacterial agents as the cause of diseases classified elsewhere: Secondary | ICD-10-CM

## 2023-05-20 DIAGNOSIS — J019 Acute sinusitis, unspecified: Secondary | ICD-10-CM | POA: Diagnosis not present

## 2023-05-20 MED ORDER — LEVOCETIRIZINE DIHYDROCHLORIDE 5 MG PO TABS: 5.0000 mg | ORAL_TABLET | Freq: Every evening | ORAL | 3 refills | Status: DC

## 2023-05-20 MED ORDER — CEFDINIR 300 MG PO CAPS
300.0000 mg | ORAL_CAPSULE | Freq: Two times a day (BID) | ORAL | 0 refills | Status: AC
Start: 2023-05-20 — End: 2023-05-30

## 2023-05-20 NOTE — Telephone Encounter (Signed)
 Copied from CRM [id number]. Topic: Clinical - Red Word Triage >> May 20, 2023 11:20 AM Laurier BROCKS wrote: Red Word that prompted transfer to Nurse Triage: Patient is having a cough, with congestion for 7 days, his phlegm is not a green but a brownish color. He isn't sure if it's a sinus infection or a cold.   Chief Complaint: Cold-like symptoms Symptoms: Runny nose, persistent cough, mild SOB at times  Frequency: Ongoing for 1 week Disposition: [] ED /[] Urgent Care (no appt availability in office) / [x] Appointment(In office/virtual)/ []  [facility]/ [] Home Care/ [] Refused Recommended Disposition /[] [facility]/ []  Follow-up with PCP Additional Notes: Patient reported cold-like symptoms ongoing for 1 week. Patient concerned because symptoms have persisted without improvement for a week now and he is questioning if he has something else going on other than a cold. Appointment scheduled for today.   Answer Assessment - Initial Assessment Questions 1. ONSET: When did the nasal discharge start?      Yellow and brown, beige  2. AMOUNT: How much discharge is there?      A lot nasal discharge (going through 2 roll of toilet paper) Dried blood in nose also  3. COUGH: Do you have a cough? If Yes, ask: Describe the color of your sputum (clear, white, yellow, green)     Coughing mostly at night  4. RESPIRATORY DISTRESS: Describe your breathing.      Has fibrosis, patient a little of SOB at times  5. FEVER: Do you have a fever? If Yes, ask: What is your temperature, how was it measured, and when did it start?     Denies fever, Felt warm at times, has taken Tylenol  and Ibuprofen   6. SEVERITY: Overall, how bad are you feeling right now? (e.g., doesn't interfere with normal activities, staying home from school/work, staying in bed)      Feeling the same since symptoms started , no improvement  7. OTHER SYMPTOMS: Do you have any other symptoms? (e.g., sore throat,  earache, wheezing, vomiting) Sometimes has ear pain when trying to clear sinuses.  Protocols used: Common Cold-A-AH

## 2023-05-20 NOTE — Progress Notes (Signed)
 Subjective: CC:URI PCP: Lavell Bari LABOR, FNP YEP:Frederick Williams is a 63 y.o. male presenting to clinic today for:  1. URI Patient reports that he has a 1 week history of worsening URI.  He started having clear drainage and sinus pressure and that is since progressed to brown discharge and bleeding nares.  He reports facial pain.  No fevers.  No chills.  He had some slight sore throat, sneezing and congestion last Monday.  He is not utilizing any nasal sprays because he just cannot stand to put things up his nose.  He had COVID 3 weeks ago and prior to that had some type of upper respiratory infection.  He has history of pulmonary fibrosis.  Needs a refill on Xyzal    ROS: Per HPI  Allergies  Allergen Reactions   Ciprofloxacin      Tendon pain   Prednisone  Other (See Comments)    Severe abd cramping   Penicillins Rash   Past Medical History:  Diagnosis Date   Pneumonia due to COVID-19 virus     Current Outpatient Medications:    albuterol  (VENTOLIN  HFA) 108 (90 Base) MCG/ACT inhaler, INHALE 1-2 PUFFS EVERY 2 HOURS AS NEED FOR WHEEZING OR SHORT BREATH. (NEEDS SEEN BEFORE NEXT REFILL), Disp: 18 each, Rfl: 2   cefdinir  (OMNICEF ) 300 MG capsule, Take 1 capsule (300 mg total) by mouth 2 (two) times daily for 10 days., Disp: 20 capsule, Rfl: 0   Ensure (ENSURE), Take 237 mLs by mouth daily., Disp: , Rfl:    rosuvastatin  (CRESTOR ) 5 MG tablet, Take 1 tablet (5 mg total) by mouth daily., Disp: 90 tablet, Rfl: 3   levocetirizine (XYZAL ) 5 MG tablet, Take 1 tablet (5 mg total) by mouth every evening., Disp: 90 tablet, Rfl: 3 Social History   Socioeconomic History   Marital status: Married    Spouse name: Not on file   Number of children: Not on file   Years of education: Not on file   Highest education level: Not on file  Occupational History   Not on file  Tobacco Use   Smoking status: Former    Current packs/day: 0.00    Average packs/day: 2.0 packs/day for 32.0 years (64.0 ttl  pk-yrs)    Types: Cigarettes    Start date: 15    Quit date: 2013    Years since quitting: 12.0   Smokeless tobacco: Former    Quit date: 02/02/2011  Vaping Use   Vaping status: Former  Substance and Sexual Activity   Alcohol use: Not Currently   Drug use: Not Currently    Types: Marijuana   Sexual activity: Not on file  Other Topics Concern   Not on file  Social History Narrative   Not on file   Social Drivers of Health   Financial Resource Strain: Not on file  Food Insecurity: Not on file  Transportation Needs: Not on file  Physical Activity: Not on file  Stress: Not on file  Social Connections: Unknown (01/26/2022)   Received from [facility]   Social Network    Social Network: Not on file  Intimate Partner Violence: Unknown (01/26/2022)   Received from [facility]   HITS    Physically Hurt: Not on file    Insult or Talk Down To: Not on file    Threaten Physical Harm: Not on file    Scream or Curse: Not on file   Family History  Problem Relation Age of Onset  Dementia Mother    Hypertension Sister     Objective: Office vital signs reviewed. BP 119/70   Pulse 81   Temp 98.3 F (36.8 C)   Ht 6' (1.829 m)   Wt 158 lb (71.7 kg)   SpO2 97%   BMI 21.43 kg/m   Physical Examination:  General: Awake, alert, well nourished, No acute distress HEENT: Normal    Neck: No masses palpated. No lymphadenopathy    Ears: Tympanic membranes intact, normal light reflex, no erythema, no bulging    Eyes: PERRLA, extraocular membranes intact, sclera white    Nose: nasal turbinates moist, opaque nasal discharge. Left nare with a hemostatic bleed    Throat: moist mucus membranes, no erythema  Cardio: regular rate and rhythm, S1S2 heard, no murmurs appreciated Pulm: clear to auscultation bilaterally, no wheezes, rhonchi or rales; normal work of breathing on room air  Assessment/ Plan: 63 y.o. male   Acute bacterial sinusitis - Plan:  levocetirizine (XYZAL ) 5 MG tablet, cefdinir  (OMNICEF ) 300 MG capsule  Omnicef  sent.  Has tolerated in the past.  Xyzal  renewed.  Discussed consideration for nasal lavage to prevent secondary bacterial infections going forward.  Follow-up as needed   Norene CHRISTELLA Fielding, DO [facility] [phone]

## 2023-05-28 ENCOUNTER — Other Ambulatory Visit: Payer: Self-pay | Admitting: Family

## 2023-05-28 DIAGNOSIS — H6992 Unspecified Eustachian tube disorder, left ear: Secondary | ICD-10-CM

## 2023-08-02 ENCOUNTER — Other Ambulatory Visit: Payer: Self-pay | Admitting: Family

## 2023-08-02 DIAGNOSIS — Z8616 Personal history of COVID-19: Secondary | ICD-10-CM

## 2023-08-02 DIAGNOSIS — J8410 Pulmonary fibrosis, unspecified: Secondary | ICD-10-CM

## 2023-08-02 DIAGNOSIS — R0609 Other forms of dyspnea: Secondary | ICD-10-CM

## 2023-09-02 ENCOUNTER — Encounter: Payer: Self-pay | Admitting: Family

## 2023-09-02 ENCOUNTER — Ambulatory Visit (INDEPENDENT_AMBULATORY_CARE_PROVIDER_SITE_OTHER): Payer: PPO | Admitting: Family

## 2023-09-02 VITALS — BP 124/85 | HR 69 | Temp 97.8°F | Ht 72.0 in | Wt 162.4 lb

## 2023-09-02 DIAGNOSIS — Z0001 Encounter for general adult medical examination with abnormal findings: Secondary | ICD-10-CM

## 2023-09-02 DIAGNOSIS — Z0000 Encounter for general adult medical examination without abnormal findings: Secondary | ICD-10-CM | POA: Diagnosis not present

## 2023-09-02 DIAGNOSIS — J8410 Pulmonary fibrosis, unspecified: Secondary | ICD-10-CM

## 2023-09-02 DIAGNOSIS — Z8616 Personal history of COVID-19: Secondary | ICD-10-CM

## 2023-09-02 DIAGNOSIS — R0609 Other forms of dyspnea: Secondary | ICD-10-CM | POA: Diagnosis not present

## 2023-09-02 MED ORDER — LEVOCETIRIZINE DIHYDROCHLORIDE 5 MG PO TABS
5.0000 mg | ORAL_TABLET | Freq: Every evening | ORAL | 4 refills | Status: AC
Start: 2023-09-02 — End: ?

## 2023-09-02 MED ORDER — ALBUTEROL SULFATE HFA 108 (90 BASE) MCG/ACT IN AERS
INHALATION_SPRAY | RESPIRATORY_TRACT | 12 refills | Status: AC
Start: 2023-09-02 — End: ?

## 2023-09-02 NOTE — Patient Instructions (Signed)
 Health Maintenance, Male  Adopting a healthy lifestyle and getting preventive care are important in promoting health and wellness. Ask your health care provider about:  The right schedule for you to have regular tests and exams.  Things you can do on your own to prevent diseases and keep yourself healthy.  What should I know about diet, weight, and exercise?  Eat a healthy diet    Eat a diet that includes plenty of vegetables, fruits, low-fat dairy products, and lean protein.  Do not eat a lot of foods that are high in solid fats, added sugars, or sodium.  Maintain a healthy weight  Body mass index (BMI) is a measurement that can be used to identify possible weight problems. It estimates body fat based on height and weight. Your health care provider can help determine your BMI and help you achieve or maintain a healthy weight.  Get regular exercise  Get regular exercise. This is one of the most important things you can do for your health. Most adults should:  Exercise for at least 150 minutes each week. The exercise should increase your heart rate and make you sweat (moderate-intensity exercise).  Do strengthening exercises at least twice a week. This is in addition to the moderate-intensity exercise.  Spend less time sitting. Even light physical activity can be beneficial.  Watch cholesterol and blood lipids  Have your blood tested for lipids and cholesterol at 63 years of age, then have this test every 5 years.  You may need to have your cholesterol levels checked more often if:  Your lipid or cholesterol levels are high.  You are older than 63 years of age.  You are at high risk for heart disease.  What should I know about cancer screening?  Many types of cancers can be detected early and may often be prevented. Depending on your health history and family history, you may need to have cancer screening at various ages. This may include screening for:  Colorectal cancer.  Prostate cancer.  Skin cancer.  Lung  cancer.  What should I know about heart disease, diabetes, and high blood pressure?  Blood pressure and heart disease  High blood pressure causes heart disease and increases the risk of stroke. This is more likely to develop in people who have high blood pressure readings or are overweight.  Talk with your health care provider about your target blood pressure readings.  Have your blood pressure checked:  Every 3-5 years if you are 9-95 years of age.  Every year if you are 85 years old or older.  If you are between the ages of 29 and 29 and are a current or former smoker, ask your health care provider if you should have a one-time screening for abdominal aortic aneurysm (AAA).  Diabetes  Have regular diabetes screenings. This checks your fasting blood sugar level. Have the screening done:  Once every three years after age 23 if you are at a normal weight and have a low risk for diabetes.  More often and at a younger age if you are overweight or have a high risk for diabetes.  What should I know about preventing infection?  Hepatitis B  If you have a higher risk for hepatitis B, you should be screened for this virus. Talk with your health care provider to find out if you are at risk for hepatitis B infection.  Hepatitis C  Blood testing is recommended for:  Everyone born from [birth year] through 1965.  Anyone  with known risk factors for hepatitis C.  Sexually transmitted infections (STIs)  You should be screened each year for STIs, including gonorrhea and chlamydia, if:  You are sexually active and are younger than 62 years of age.  You are older than 63 years of age and your health care provider tells you that you are at risk for this type of infection.  Your sexual activity has changed since you were last screened, and you are at increased risk for chlamydia or gonorrhea. Ask your health care provider if you are at risk.  Ask your health care provider about whether you are at high risk for HIV. Your health care provider  may recommend a prescription medicine to help prevent HIV infection. If you choose to take medicine to prevent HIV, you should first get tested for HIV. You should then be tested every 3 months for as long as you are taking the medicine.  Follow these instructions at home:  Alcohol use  Do not drink alcohol if your health care provider tells you not to drink.  If you drink alcohol:  Limit how much you have to 0-2 drinks a day.  Know how much alcohol is in your drink. In the [place] one drink equals one 12 oz bottle of beer (355 mL), one 5 oz glass of wine (148 mL), or one 1 oz glass of hard liquor (44 mL).  Lifestyle  Do not use any products that contain nicotine or tobacco. These products include cigarettes, chewing tobacco, and vaping devices, such as e-cigarettes. If you need help quitting, ask your health care provider.  Do not use street drugs.  Do not share needles.  Ask your health care provider for help if you need support or information about quitting drugs.  General instructions  Schedule regular health, dental, and eye exams.  Stay current with your vaccines.  Tell your health care provider if:  You often feel depressed.  You have ever been abused or do not feel safe at home.  Summary  Adopting a healthy lifestyle and getting preventive care are important in promoting health and wellness.  Follow your health care provider's instructions about healthy diet, exercising, and getting tested or screened for diseases.  Follow your health care provider's instructions on monitoring your cholesterol and blood pressure.  This information is not intended to replace advice given to you by your health care provider. Make sure you discuss any questions you have with your health care provider.  Document Revised: 09/12/2020 Document Reviewed: 09/12/2020  Elsevier Patient Education  2024 [employer].

## 2023-09-02 NOTE — Progress Notes (Signed)
 Subjective:    Patient ID: Frederick Williams, male    DOB: 03-26-1961, 63 y.o.   MRN: [id number]  Chief Complaint  Patient presents with   Annual Exam   PT presents to the office today for CPE.   He has hx of COVID pneumonia and has seen Pulmonologist and diagnosed with Postinflammatory pulmonary fibrosis. He completed pulmonary rehab. He continues to have SOB on exertion. Uses albuterol  as needed that helps.   Shortness of Breath This is a chronic problem. The current episode started more than 1 year ago. The problem occurs intermittently. The symptoms are aggravated by exercise.      Review of Systems  Respiratory:  Positive for shortness of breath.   All other systems reviewed and are negative.   Family History  Problem Relation Age of Onset   Dementia Mother    Hypertension Sister    Social History   Socioeconomic History   Marital status: Married    Spouse name: Not on file   Number of children: Not on file   Years of education: Not on file   Highest education level: Not on file  Occupational History   Not on file  Tobacco Use   Smoking status: Former    Current packs/day: 0.00    Average packs/day: 2.0 packs/day for 32.0 years (64.0 ttl pk-yrs)    Types: Cigarettes    Start date: 73    Quit date: 2013    Years since quitting: 12.3   Smokeless tobacco: Former    Quit date: 02/02/2011  Vaping Use   Vaping status: Former  Substance and Sexual Activity   Alcohol use: Not Currently   Drug use: Not Currently    Types: Marijuana   Sexual activity: Not on file  Other Topics Concern   Not on file  Social History Narrative   Not on file   Social Drivers of Health   Financial Resource Strain: Not on file  Food Insecurity: Not on file  Transportation Needs: Not on file  Physical Activity: Not on file  Stress: Not on file  Social Connections: Unknown (01/26/2022)   Received from [facility]   Social Network    Social Network: Not on file        Objective:   Physical Exam Vitals reviewed.  Constitutional:      General: He is not in acute distress.    Appearance: He is well-developed.  HENT:     Head: Normocephalic.     Right Ear: Tympanic membrane normal.     Left Ear: Tympanic membrane normal.  Eyes:     General:        Right eye: No discharge.        Left eye: No discharge.     Pupils: Pupils are equal, round, and reactive to light.  Neck:     Thyroid : No thyromegaly.  Cardiovascular:     Rate and Rhythm: Normal rate and regular rhythm.     Heart sounds: Normal heart sounds. No murmur heard. Pulmonary:     Effort: Pulmonary effort is normal. No respiratory distress.     Breath sounds: Normal breath sounds. No wheezing.  Abdominal:     General: Bowel sounds are normal. There is no distension.     Palpations: Abdomen is soft.     Tenderness: There is no abdominal tenderness.  Musculoskeletal:        General: No tenderness. Normal range of motion.     Cervical  back: Normal range of motion and neck supple.  Skin:    General: Skin is warm and dry.     Findings: No erythema or rash.  Neurological:     Mental Status: He is alert and oriented to person, place, and time.     Cranial Nerves: No cranial nerve deficit.     Deep Tendon Reflexes: Reflexes are normal and symmetric.  Psychiatric:        Behavior: Behavior normal.        Thought Content: Thought content normal.        Judgment: Judgment normal.      BP 124/85   Pulse 69   Temp 97.8 F (36.6 C) (Temporal)   Ht 6' (1.829 m)   Wt 162 lb 6.4 oz (73.7 kg)   SpO2 96%   BMI 22.03 kg/m      Assessment & Plan:   Frederick Williams comes in today with chief complaint of Annual Exam   Diagnosis and orders addressed:  1. History of COVID-19 - albuterol  (VENTOLIN  HFA) 108 (90 Base) MCG/ACT inhaler; INHALE 1-2 PUFFS EVERY 2 HOURS AS NEED FOR WHEEZING OR SHORT BREATH.  Dispense: 18 each; Refill: 12  2. Dyspnea on exertion - albuterol  (VENTOLIN  HFA)  108 (90 Base) MCG/ACT inhaler; INHALE 1-2 PUFFS EVERY 2 HOURS AS NEED FOR WHEEZING OR SHORT BREATH.  Dispense: 18 each; Refill: 12 - levocetirizine (XYZAL ) 5 MG tablet; Take 1 tablet (5 mg total) by mouth every evening.  Dispense: 90 tablet; Refill: 4  3. Postinflammatory pulmonary fibrosis (HCC) - albuterol  (VENTOLIN  HFA) 108 (90 Base) MCG/ACT inhaler; INHALE 1-2 PUFFS EVERY 2 HOURS AS NEED FOR WHEEZING OR SHORT BREATH.  Dispense: 18 each; Refill: 12 - levocetirizine (XYZAL ) 5 MG tablet; Take 1 tablet (5 mg total) by mouth every evening.  Dispense: 90 tablet; Refill: 4  4. Annual physical exam (Primary) - CMP14+EGFR - CBC with Differential/Platelet - Lipid panel - PSA, total and free   Labs pending Health Maintenance reviewed- Refuses all HM at this time.  Continue current medications  Diet and exercise encouraged  Follow up plan: 1 year    Tommas Fragmin, FNP

## 2023-09-03 ENCOUNTER — Other Ambulatory Visit: Payer: Self-pay | Admitting: Family

## 2023-09-03 LAB — CBC WITH DIFFERENTIAL/PLATELET
Basophils Absolute: 0 10*3/uL (ref 0.0–0.2)
Basos: 0 %
EOS (ABSOLUTE): 0.1 10*3/uL (ref 0.0–0.4)
Eos: 2 %
Hematocrit: 50.5 % (ref 37.5–51.0)
Hemoglobin: 17.2 g/dL (ref 13.0–17.7)
Immature Grans (Abs): 0 10*3/uL (ref 0.0–0.1)
Immature Granulocytes: 0 %
Lymphocytes Absolute: 1.1 10*3/uL (ref 0.7–3.1)
Lymphs: 21 %
MCH: 31.3 pg (ref 26.6–33.0)
MCHC: 34.1 g/dL (ref 31.5–35.7)
MCV: 92 fL (ref 79–97)
Monocytes Absolute: 0.4 10*3/uL (ref 0.1–0.9)
Monocytes: 8 %
Neutrophils Absolute: 3.7 10*3/uL (ref 1.4–7.0)
Neutrophils: 69 %
Platelets: 196 10*3/uL (ref 150–450)
RBC: 5.49 x10E6/uL (ref 4.14–5.80)
RDW: 12.6 % (ref 11.6–15.4)
WBC: 5.3 10*3/uL (ref 3.4–10.8)

## 2023-09-03 LAB — CMP14+EGFR
ALT: 18 IU/L (ref 0–44)
AST: 19 IU/L (ref 0–40)
Albumin: 4.7 g/dL (ref 3.9–4.9)
Alkaline Phosphatase: 85 IU/L (ref 44–121)
BUN/Creatinine Ratio: 12 (ref 10–24)
BUN: 15 mg/dL (ref 8–27)
Bilirubin Total: 0.5 mg/dL (ref 0.0–1.2)
CO2: 23 mmol/L (ref 20–29)
Calcium: 9.4 mg/dL (ref 8.6–10.2)
Chloride: 104 mmol/L (ref 96–106)
Creatinine, Ser: 1.25 mg/dL (ref 0.76–1.27)
Globulin, Total: 2.2 g/dL (ref 1.5–4.5)
Glucose: 95 mg/dL (ref 70–99)
Potassium: 4.9 mmol/L (ref 3.5–5.2)
Sodium: 141 mmol/L (ref 134–144)
Total Protein: 6.9 g/dL (ref 6.0–8.5)
eGFR: 65 mL/min/{1.73_m2} (ref >59)

## 2023-09-03 LAB — PSA, TOTAL AND FREE
PSA, Free Pct: 42.6 %
PSA, Free: 0.81 ng/mL
Prostate Specific Ag, Serum: 1.9 ng/mL (ref 0.0–4.0)

## 2023-09-03 LAB — LIPID PANEL
Chol/HDL Ratio: 4.3 ratio (ref 0.0–5.0)
Cholesterol, Total: 183 mg/dL (ref 100–199)
HDL: 43 mg/dL (ref >39)
LDL Chol Calc (NIH): 123 mg/dL — ABNORMAL HIGH (ref 0–99)
Triglycerides: 91 mg/dL (ref 0–149)
VLDL Cholesterol Cal: 17 mg/dL (ref 5–40)

## 2023-09-03 MED ORDER — ATORVASTATIN CALCIUM 20 MG PO TABS: 20.0000 mg | ORAL_TABLET | Freq: Every day | ORAL | 3 refills | Status: AC

## 2023-12-05 ENCOUNTER — Other Ambulatory Visit: Payer: Self-pay | Admitting: Family

## 2024-09-03 ENCOUNTER — Encounter: Payer: Self-pay | Admitting: Family
